# Patient Record
Sex: Female | Born: 1980
Health system: Southern US, Community
[De-identification: ages and names within clinical notes are randomized; demographics above are authoritative.]

## PROBLEM LIST (undated history)

## (undated) ENCOUNTER — Inpatient Hospital Stay (HOSPITAL_COMMUNITY): Payer: Medicaid Other

## (undated) DIAGNOSIS — M1990 Unspecified osteoarthritis, unspecified site: Secondary | ICD-10-CM

## (undated) DIAGNOSIS — K219 Gastro-esophageal reflux disease without esophagitis: Secondary | ICD-10-CM

## (undated) DIAGNOSIS — J45909 Unspecified asthma, uncomplicated: Secondary | ICD-10-CM

## (undated) DIAGNOSIS — Z8669 Personal history of other diseases of the nervous system and sense organs: Secondary | ICD-10-CM

## (undated) DIAGNOSIS — M5432 Sciatica, left side: Secondary | ICD-10-CM

## (undated) DIAGNOSIS — R87629 Unspecified abnormal cytological findings in specimens from vagina: Secondary | ICD-10-CM

## (undated) DIAGNOSIS — I10 Essential (primary) hypertension: Secondary | ICD-10-CM

## (undated) DIAGNOSIS — M797 Fibromyalgia: Secondary | ICD-10-CM

## (undated) DIAGNOSIS — K2970 Gastritis, unspecified, without bleeding: Secondary | ICD-10-CM

## (undated) DIAGNOSIS — Z8719 Personal history of other diseases of the digestive system: Secondary | ICD-10-CM

## (undated) DIAGNOSIS — N12 Tubulo-interstitial nephritis, not specified as acute or chronic: Secondary | ICD-10-CM

## (undated) DIAGNOSIS — R519 Headache, unspecified: Secondary | ICD-10-CM

## (undated) HISTORY — DX: Essential (primary) hypertension: I10

## (undated) HISTORY — PX: DILATION AND CURETTAGE OF UTERUS: SHX78

## (undated) HISTORY — PX: MOUTH SURGERY: SHX715

---
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
## 2014-03-31 ENCOUNTER — Encounter (HOSPITAL_COMMUNITY)
Admission: RE | Admit: 2014-03-31 | Discharge: 2014-03-31 | Disposition: A | Discharge status: Home / Self Care | Payer: Medicaid Other | Source: Ambulatory Visit | Attending: General Surgery | Admitting: General Surgery

## 2014-03-31 NOTE — Consult Note (Signed)
Mia Waters, Mia Waters                 ACCOUNT NO.:  [id number]  MEDICAL RECORD NO.:  [id number]  LOCATION:                                 FACILITY:  PHYSICIAN:  Mia Waters, M.D. DATE OF BIRTH:  09-03-1980  DATE OF CONSULTATION:  03/28/2014 DATE OF DISCHARGE:                                CONSULTATION   HISTORY OF PRESENT ILLNESS:  Surgery was asked to see this 33 year old white female for an umbilical hernia.  In essence, she has had this for the last 2 years, likely related to her many pregnancies.  Occasionally, this is incarcerated but it is reducible.  This has been causing her increased discomfort.  Also, she has had other gastrointestinal symptoms that have been under review by her medical service and will be requiring a colonoscopy as well.  The patient states that sometimes she has nausea and she has some diarrhea and constipation.  She has no bleeding, no mucusy diarrhea.  No unexplained weight loss.  No family history of irritable bowel syndrome or inflammatory bowel disease.  She had a CT scan in [place], the results of which are pending for [place].  Physical examination discloses a very pleasant 33 year old white female, in no acute distress.  PAST MEDICAL HISTORY:  Fairly unremarkable except for these GI complaints and some history of reflux.  She has no current medical problems.  She has switched over to a more of a vegetarian diet and actually her symptoms have improved.  PAST SURGICAL HISTORY:  She has had oral surgery in the past.  No other surgery.  ALLERGIES:  She has no known allergies.  She does smoke about 10 cigarettes per day.  No history of alcohol abuse.  PHYSICAL EXAMINATION:  VITAL SIGNS:  She is 5 feet 8 inches, weighs 218 pounds.  Temperature is 97.0, pulse 68, respirations 12, blood pressure 120/80. HEENT:  Head is normocephalic.  Eyes, extraocular movements are intact. Pupils are round and reactive to light and accommodation.  There is  no conjunctival pallor or scleral injection.  Sclerae have a normal tincture. NECK:  There are no bruits appreciated.  No adenopathy.  No thyromegaly. CHEST:  Clear, both anterior and posterior auscultation. HEART:  Regular rhythm. ABDOMEN:  Soft.  She has an umbilical hernia that is reducible.  It is approximately 2 cm in diameter. RECTAL:  Stool is guaiac negative. PELVIC:  Deferred. EXTREMITIES:  Within normal limits.  REVIEW OF SYSTEMS:  Neurological:  History of migraines.  No history of seizures or any lateralizing neurological findings.  Endocrine:  No history of diabetes, thyroid disease, or adrenal problems. Cardiopulmonary:  The patient does use tobacco.  Musculoskeletal:  The patient is obese.  Ob/Gyn History:  She is a gravida 6, para 5, cesarean 0, abortus 1 patient with no family history of carcinoma of the breast. She has never had a mammogram.  Last menstrual period was March 18, 2014.  GI:  History of reflux, history of recurrent constipation and diarrhea, has somewhat improved with Bentyl and diet manipulation.  No past history of hepatitis.  No past history of bright red rectal bleeding, melena; and as stated, no family history of  inflammatory bowel disease or irritable bowel syndrome.  No unexplained weight loss.  Never had a colonoscopy, although this might be a consideration.  GU:  No history of frequency, dysuria, or kidney stones.  REVIEW OF HISTORY AND PHYSICAL:  Therefore, Ms. Mia Waters is a 33 year old white female who has an umbilical hernia which we will repair via the Outpatient Department.  I think, part of her abdominal discomfort comes from this as this occasionally is incarcerated.  I told her how to relax her musculature in her abdomen and to massage this to make sure that this is reducible and to call us should she have any acute problems with that.  We will also check her CT scan prior to surgery.  We discussed complications including bleeding,  infection, and recurrence, and the possibility of mesh prosthesis might be required.  Informed consent was obtained.     Mia BarthelWilliam Kenita Waters, M.D.     WB/MEDQ  D:  03/28/2014  T:  03/28/2014  Job:  [id number]  cc:   [facility]

## 2014-03-31 NOTE — Pre-Procedure Instructions (Signed)
Patient did not show for pre op appointment.  Linda at Dr Bertram SavinBradfords office made aware.  Attempted to contact patient.  No answer or option to leave a message.

## 2014-04-01 ENCOUNTER — Encounter (HOSPITAL_COMMUNITY): Payer: Self-pay | Admitting: Pharmacy Technician

## 2014-04-01 ENCOUNTER — Encounter (HOSPITAL_COMMUNITY)
Admission: RE | Admit: 2014-04-01 | Discharge: 2014-04-01 | Disposition: A | Discharge status: Home / Self Care | Payer: Medicaid Other | Source: Ambulatory Visit | Attending: General Surgery | Admitting: General Surgery

## 2014-04-01 ENCOUNTER — Encounter (HOSPITAL_COMMUNITY): Payer: Self-pay

## 2014-04-01 DIAGNOSIS — K219 Gastro-esophageal reflux disease without esophagitis: Secondary | ICD-10-CM | POA: Diagnosis not present

## 2014-04-01 DIAGNOSIS — K420 Umbilical hernia with obstruction, without gangrene: Secondary | ICD-10-CM | POA: Diagnosis not present

## 2014-04-01 HISTORY — DX: Gastro-esophageal reflux disease without esophagitis: K21.9

## 2014-04-01 HISTORY — DX: Personal history of other diseases of the nervous system and sense organs: Z86.69

## 2014-04-01 HISTORY — DX: Personal history of other diseases of the digestive system: Z87.19

## 2014-04-01 HISTORY — DX: Unspecified asthma, uncomplicated: J45.909

## 2014-04-01 LAB — CBC WITH DIFFERENTIAL/PLATELET
Basophils Absolute: 0.1 10*3/uL (ref 0.0–0.1)
Basophils Relative: 1 % (ref 0–1)
Eosinophils Absolute: 0.5 10*3/uL (ref 0.0–0.7)
Eosinophils Relative: 5 % (ref 0–5)
HCT: 38.8 % (ref 36.0–46.0)
Hemoglobin: 12.9 g/dL (ref 12.0–15.0)
Lymphocytes Relative: 33 % (ref 12–46)
Lymphs Abs: 3.7 10*3/uL (ref 0.7–4.0)
MCH: 30.6 pg (ref 26.0–34.0)
MCHC: 33.2 g/dL (ref 30.0–36.0)
MCV: 92.2 fL (ref 78.0–100.0)
Monocytes Absolute: 0.7 10*3/uL (ref 0.1–1.0)
Monocytes Relative: 6 % (ref 3–12)
Neutro Abs: 6.2 10*3/uL (ref 1.7–7.7)
Neutrophils Relative %: 55 % (ref 43–77)
Platelets: 395 10*3/uL (ref 150–400)
RBC: 4.21 MIL/uL (ref 3.87–5.11)
RDW: 13.6 % (ref 11.5–15.5)
WBC: 11.2 10*3/uL — ABNORMAL HIGH (ref 4.0–10.5)

## 2014-04-01 LAB — BASIC METABOLIC PANEL
Anion gap: 15 (ref 5–15)
BUN: 5 mg/dL — ABNORMAL LOW (ref 6–23)
CO2: 24 mEq/L (ref 19–32)
Calcium: 9.5 mg/dL (ref 8.4–10.5)
Chloride: 100 mEq/L (ref 96–112)
Creatinine, Ser: 0.76 mg/dL (ref 0.50–1.10)
GFR calc Af Amer: >90 mL/min (ref >90)
GFR calc non Af Amer: >90 mL/min (ref >90)
Glucose, Bld: 90 mg/dL (ref 70–99)
Potassium: 4 mEq/L (ref 3.7–5.3)
Sodium: 139 mEq/L (ref 137–147)

## 2014-04-01 LAB — HCG, SERUM, QUALITATIVE: Preg, Serum: NEGATIVE

## 2014-04-01 NOTE — Patient Instructions (Signed)
Mia Waters  04/01/2014   Your procedure is scheduled on:  Thursday, 04/03/14  [name] at [facility] AM.  Call this number if you have problems the morning of surgery: [phone]   Remember:   Do not eat food or drink liquids after midnight.   Take these medicines the morning of surgery with A SIP OF WATER: prilosec   Do not wear jewelry, make-up or nail polish.  Do not wear lotions, powders, or perfumes. You may wear deodorant.  Do not shave 48 hours prior to surgery. Men may shave face and neck.  Do not bring valuables to the hospital.  [facility] is not responsible                  for any belongings or valuables.               Contacts, dentures or bridgework may not be worn into surgery.  Leave suitcase in the car. After surgery it may be brought to your room.  For patients admitted to the hospital, discharge time is determined by your                treatment team.               Patients discharged the day of surgery will not be allowed to drive  home.  Name and phone number of your driver: family  Special Instructions: Shower using CHG 2 nights before surgery and the night before surgery.  If you shower the day of surgery use CHG.  Use special wash - you have one bottle of CHG for all showers.  You should use approximately 1/3 of the bottle for each shower.   Please read over the following fact sheets that you were given: Anesthesia Post-op Instructions and Care and Recovery After Surgery Umbilical Herniorrhaphy, Care After Refer to this sheet in the next few weeks. These instructions provide you with information on caring for yourself after your procedure. Your health care provider may also give you more specific instructions. Your treatment has been planned according to current medical practices, but problems sometimes occur. Call your health care provider if you have any problems or questions after your procedure. HOME CARE INSTRUCTIONS  If you are given antibiotic  medicine, take it as directed. Finish it even if you start to feel better.  Only take over-the-counter or prescription medicines for pain, fever, or discomfort as directed by your health care provider. Do not take aspirin. It can cause bleeding.  Do not get your surgical cut (incision) area wet unless your health care provider says it is okay.  Avoid lifting objects heavier than 10 lb (4.5 kg) for 8 weeks after surgery.  Avoid sexual activity for 5 weeks after surgery or as directed by your health care provider.  Do not drive while taking prescription pain medicine.  You may return to your other normal, daily activities after 3 days or as directed by your health care provider. SEEK MEDICAL CARE IF:  You notice blood or fluid leaking from the surgical site.  Your incision area becomes red or swollen.  Your pain at the surgical site becomes worse or is not relieved by medicine.  You have problems urinating.  You feel nauseous or vomit more than 2 days after surgery.  You notice the bulge in your abdomen returns after the procedure. SEEK IMMEDIATE MEDICAL CARE IF:  You have a fever.  You have nausea or vomiting that will  not stop. Document Released: 12/13/2011 Document Revised: 10/28/2013 Document Reviewed: 12/13/2011 [facility] Patient Information 2015 [place], [place]. This information is not intended to replace advice given to you by your health care provider. Make sure you discuss any questions you have with your health care provider. General Anesthesia, Care After Refer to this sheet in the next few weeks. These instructions provide you with information on caring for yourself after your procedure. Your health care provider may also give you more specific instructions. Your treatment has been planned according to current medical practices, but problems sometimes occur. Call your health care provider if you have any problems or questions after your procedure. WHAT TO EXPECT AFTER THE  PROCEDURE After the procedure, it is typical to experience:  Sleepiness.  Nausea and vomiting. HOME CARE INSTRUCTIONS  For the first 24 hours after general anesthesia:  Have a responsible person with you.  Do not drive a car. If you are alone, do not take public transportation.  Do not drink alcohol.  Do not take medicine that has not been prescribed by your health care provider.  Do not sign important papers or make important decisions.  You may resume a normal diet and activities as directed by your health care provider.  Change bandages (dressings) as directed.  If you have questions or problems that seem related to general anesthesia, call the hospital and ask for the anesthetist or anesthesiologist on call. SEEK MEDICAL CARE IF:  You have nausea and vomiting that continue the day after anesthesia.  You develop a rash. SEEK IMMEDIATE MEDICAL CARE IF:   You have difficulty breathing.  You have chest pain.  You have any allergic problems. Document Released: 09/19/2000 Document Revised: 06/18/2013 Document Reviewed: 12/27/2012 [facility] Patient Information 2015 [place], [place]. This information is not intended to replace advice given to you by your health care provider. Make sure you discuss any questions you have with your health care provider.

## 2014-04-03 ENCOUNTER — Ambulatory Visit (HOSPITAL_COMMUNITY)
Admission: RE | Admit: 2014-04-03 | Discharge: 2014-04-04 | Disposition: A | Discharge status: Home / Self Care | Payer: Medicaid Other | Source: Ambulatory Visit | Attending: General Surgery | Admitting: General Surgery

## 2014-04-03 ENCOUNTER — Encounter (HOSPITAL_COMMUNITY)
Admission: RE | Disposition: A | Discharge status: Home / Self Care | Payer: Self-pay | Source: Ambulatory Visit | Attending: General Surgery

## 2014-04-03 ENCOUNTER — Observation Stay (HOSPITAL_COMMUNITY): Payer: Medicaid Other | Admitting: Anesthesiology

## 2014-04-03 ENCOUNTER — Encounter (HOSPITAL_COMMUNITY): Payer: Self-pay | Admitting: *Deleted

## 2014-04-03 ENCOUNTER — Encounter (HOSPITAL_COMMUNITY): Payer: Medicaid Other | Admitting: Anesthesiology

## 2014-04-03 DIAGNOSIS — K420 Umbilical hernia with obstruction, without gangrene: Secondary | ICD-10-CM | POA: Diagnosis present

## 2014-04-03 DIAGNOSIS — K219 Gastro-esophageal reflux disease without esophagitis: Secondary | ICD-10-CM | POA: Diagnosis not present

## 2014-04-03 HISTORY — PX: UMBILICAL HERNIA REPAIR: SHX196

## 2014-04-03 SURGERY — REPAIR, HERNIA, UMBILICAL, ADULT
Anesthesia: General

## 2014-04-03 MED ORDER — LIDOCAINE HCL (CARDIAC) 20 MG/ML IV SOLN
INTRAVENOUS | Status: DC | PRN
  Administered 2014-04-03: 40 mg via INTRAVENOUS

## 2014-04-03 MED ORDER — ONDANSETRON HCL 4 MG/2ML IJ SOLN
4.0000 mg | Freq: Once | INTRAMUSCULAR | Status: AC
  Administered 2014-04-03: 4 mg via INTRAVENOUS

## 2014-04-03 MED ORDER — LORAZEPAM 2 MG/ML IJ SOLN
0.5000 mg | Freq: Four times a day (QID) | INTRAMUSCULAR | Status: DC
  Administered 2014-04-03 – 2014-04-04 (×4): 0.5 mg via INTRAVENOUS
  Filled 2014-04-03 (×3): qty 1

## 2014-04-03 MED ORDER — ONDANSETRON HCL 4 MG PO TABS
4.0000 mg | ORAL_TABLET | Freq: Four times a day (QID) | ORAL | Status: DC | PRN
Start: 2014-04-03 — End: 2014-04-04

## 2014-04-03 MED ORDER — MIDAZOLAM HCL 2 MG/2ML IJ SOLN
1.0000 mg | INTRAMUSCULAR | Status: AC | PRN
  Administered 2014-04-03 (×3): 2 mg via INTRAVENOUS
  Filled 2014-04-03: qty 2

## 2014-04-03 MED ORDER — FENTANYL CITRATE 0.05 MG/ML IJ SOLN
INTRAMUSCULAR | Status: AC
  Filled 2014-04-03: qty 2

## 2014-04-03 MED ORDER — LACTATED RINGERS IV SOLN
INTRAVENOUS | Status: DC
  Administered 2014-04-03: 10:00:00 via INTRAVENOUS

## 2014-04-03 MED ORDER — GLYCOPYRROLATE 0.2 MG/ML IJ SOLN
INTRAMUSCULAR | Status: AC
  Filled 2014-04-03: qty 4

## 2014-04-03 MED ORDER — MIDAZOLAM HCL 2 MG/2ML IJ SOLN
1.0000 mg | INTRAMUSCULAR | Status: DC | PRN
  Administered 2014-04-03: 2 mg via INTRAVENOUS

## 2014-04-03 MED ORDER — NEOSTIGMINE METHYLSULFATE 10 MG/10ML IV SOLN
INTRAVENOUS | Status: AC
  Filled 2014-04-03: qty 1

## 2014-04-03 MED ORDER — ALBUTEROL SULFATE (2.5 MG/3ML) 0.083% IN NEBU
INHALATION_SOLUTION | RESPIRATORY_TRACT | Status: AC
  Filled 2014-04-03: qty 3

## 2014-04-03 MED ORDER — MIDAZOLAM HCL 2 MG/2ML IJ SOLN
INTRAMUSCULAR | Status: AC
  Filled 2014-04-03: qty 2

## 2014-04-03 MED ORDER — ALBUTEROL SULFATE (2.5 MG/3ML) 0.083% IN NEBU
2.5000 mg | INHALATION_SOLUTION | Freq: Once | RESPIRATORY_TRACT | Status: AC
  Administered 2014-04-03: 2.5 mg via RESPIRATORY_TRACT

## 2014-04-03 MED ORDER — ONDANSETRON HCL 4 MG/2ML IJ SOLN: 4.0000 mg | Freq: Once | INTRAMUSCULAR | Status: DC | PRN

## 2014-04-03 MED ORDER — FENTANYL CITRATE 0.05 MG/ML IJ SOLN
INTRAMUSCULAR | Status: AC
  Filled 2014-04-03: qty 5

## 2014-04-03 MED ORDER — SODIUM CHLORIDE 0.9 % IJ SOLN
INTRAMUSCULAR | Status: AC
  Filled 2014-04-03: qty 10

## 2014-04-03 MED ORDER — HYDROCODONE-ACETAMINOPHEN 5-325 MG PO TABS
1.0000 | ORAL_TABLET | Freq: Four times a day (QID) | ORAL | Status: DC | PRN
  Administered 2014-04-03 – 2014-04-04 (×4): 1 via ORAL
  Filled 2014-04-03 (×4): qty 1

## 2014-04-03 MED ORDER — SUCCINYLCHOLINE CHLORIDE 20 MG/ML IJ SOLN
INTRAMUSCULAR | Status: DC | PRN
  Administered 2014-04-03: 120 mg via INTRAVENOUS

## 2014-04-03 MED ORDER — PROPOFOL 10 MG/ML IV BOLUS
INTRAVENOUS | Status: DC | PRN
  Administered 2014-04-03: 150 mg via INTRAVENOUS

## 2014-04-03 MED ORDER — BUPIVACAINE HCL (PF) 0.5 % IJ SOLN
INTRAMUSCULAR | Status: AC
  Filled 2014-04-03: qty 30

## 2014-04-03 MED ORDER — ALBUTEROL SULFATE (2.5 MG/3ML) 0.083% IN NEBU: 2.5000 mg | INHALATION_SOLUTION | Freq: Four times a day (QID) | RESPIRATORY_TRACT | Status: DC | PRN

## 2014-04-03 MED ORDER — LORAZEPAM 2 MG/ML IJ SOLN
INTRAMUSCULAR | Status: AC
  Administered 2014-04-03: 0.5 mg via INTRAVENOUS
  Filled 2014-04-03: qty 1

## 2014-04-03 MED ORDER — FENTANYL CITRATE 0.05 MG/ML IJ SOLN
25.0000 ug | INTRAMUSCULAR | Status: AC
  Administered 2014-04-03 (×2): 25 ug via INTRAVENOUS

## 2014-04-03 MED ORDER — GLYCOPYRROLATE 0.2 MG/ML IJ SOLN
INTRAMUSCULAR | Status: DC | PRN
  Administered 2014-04-03: .5 mg via INTRAVENOUS

## 2014-04-03 MED ORDER — DOCUSATE SODIUM 100 MG PO CAPS
100.0000 mg | ORAL_CAPSULE | Freq: Every day | ORAL | Status: DC
Start: 2014-04-03 — End: 2014-04-04
  Administered 2014-04-03 – 2014-04-04 (×2): 100 mg via ORAL
  Filled 2014-04-03 (×2): qty 1

## 2014-04-03 MED ORDER — INFLUENZA VAC SPLIT QUAD 0.5 ML IM SUSY
0.5000 mL | PREFILLED_SYRINGE | INTRAMUSCULAR | Status: AC
  Administered 2014-04-04: 0.5 mL via INTRAMUSCULAR
  Filled 2014-04-03 (×2): qty 0.5

## 2014-04-03 MED ORDER — SUCCINYLCHOLINE CHLORIDE 20 MG/ML IJ SOLN
INTRAMUSCULAR | Status: AC
  Filled 2014-04-03: qty 1

## 2014-04-03 MED ORDER — ROCURONIUM BROMIDE 50 MG/5ML IV SOLN
INTRAVENOUS | Status: AC
  Filled 2014-04-03: qty 1

## 2014-04-03 MED ORDER — POTASSIUM CHLORIDE IN NACL 20-0.9 MEQ/L-% IV SOLN
INTRAVENOUS | Status: DC
  Administered 2014-04-03 – 2014-04-04 (×2): via INTRAVENOUS

## 2014-04-03 MED ORDER — CEFAZOLIN SODIUM-DEXTROSE 2-3 GM-% IV SOLR
2.0000 g | Freq: Once | INTRAVENOUS | Status: AC
  Administered 2014-04-03: 2 g via INTRAVENOUS
  Filled 2014-04-03 (×2): qty 50

## 2014-04-03 MED ORDER — EPHEDRINE SULFATE 50 MG/ML IJ SOLN
INTRAMUSCULAR | Status: AC
  Filled 2014-04-03: qty 1

## 2014-04-03 MED ORDER — NEOSTIGMINE METHYLSULFATE 10 MG/10ML IV SOLN
INTRAVENOUS | Status: DC | PRN
  Administered 2014-04-03: 3 mg via INTRAVENOUS

## 2014-04-03 MED ORDER — ONDANSETRON HCL 4 MG/2ML IJ SOLN: 4.0000 mg | Freq: Four times a day (QID) | INTRAMUSCULAR | Status: DC | PRN

## 2014-04-03 MED ORDER — KETOPROFEN 75 MG PO CAPS
75.0000 mg | ORAL_CAPSULE | Freq: Two times a day (BID) | ORAL | Status: DC
  Filled 2014-04-03 (×8): qty 1

## 2014-04-03 MED ORDER — BACITRACIN ZINC 500 UNIT/GM EX OINT
TOPICAL_OINTMENT | CUTANEOUS | Status: DC | PRN
  Administered 2014-04-03: 1 via TOPICAL

## 2014-04-03 MED ORDER — ONDANSETRON HCL 4 MG/2ML IJ SOLN
INTRAMUSCULAR | Status: AC
  Filled 2014-04-03: qty 2

## 2014-04-03 MED ORDER — LORAZEPAM 0.5 MG PO TABS
0.5000 mg | ORAL_TABLET | Freq: Every day | ORAL | Status: DC
  Administered 2014-04-03: 0.5 mg via ORAL
  Filled 2014-04-03: qty 1

## 2014-04-03 MED ORDER — ROCURONIUM BROMIDE 100 MG/10ML IV SOLN
INTRAVENOUS | Status: DC | PRN
  Administered 2014-04-03: 30 mg via INTRAVENOUS
  Administered 2014-04-03 (×2): 5 mg via INTRAVENOUS

## 2014-04-03 MED ORDER — PNEUMOCOCCAL VAC POLYVALENT 25 MCG/0.5ML IJ INJ
0.5000 mL | INJECTION | INTRAMUSCULAR | Status: AC
  Administered 2014-04-04: 0.5 mL via INTRAMUSCULAR
  Filled 2014-04-03 (×2): qty 0.5

## 2014-04-03 MED ORDER — ACETAMINOPHEN 325 MG PO TABS: 650.0000 mg | ORAL_TABLET | Freq: Four times a day (QID) | ORAL | Status: DC | PRN

## 2014-04-03 MED ORDER — FENTANYL CITRATE 0.05 MG/ML IJ SOLN
INTRAMUSCULAR | Status: DC | PRN
  Administered 2014-04-03 (×9): 50 ug via INTRAVENOUS

## 2014-04-03 MED ORDER — FENTANYL CITRATE 0.05 MG/ML IJ SOLN
25.0000 ug | INTRAMUSCULAR | Status: DC | PRN
  Administered 2014-04-03 (×3): 50 ug via INTRAVENOUS

## 2014-04-03 MED ORDER — 0.9 % SODIUM CHLORIDE (POUR BTL) OPTIME
TOPICAL | Status: DC | PRN
  Administered 2014-04-03: 1000 mL

## 2014-04-03 MED ORDER — MORPHINE SULFATE 2 MG/ML IJ SOLN
1.0000 mg | INTRAMUSCULAR | Status: DC | PRN
  Administered 2014-04-03 – 2014-04-04 (×7): 1 mg via INTRAVENOUS
  Filled 2014-04-03 (×7): qty 1

## 2014-04-03 MED ORDER — BACITRACIN ZINC 500 UNIT/GM EX OINT
TOPICAL_OINTMENT | CUTANEOUS | Status: AC
  Filled 2014-04-03: qty 0.9

## 2014-04-03 MED ORDER — PANTOPRAZOLE SODIUM 40 MG PO TBEC
40.0000 mg | DELAYED_RELEASE_TABLET | Freq: Every day | ORAL | Status: DC
  Administered 2014-04-03 – 2014-04-04 (×2): 40 mg via ORAL
  Filled 2014-04-03 (×2): qty 1

## 2014-04-03 SURGICAL SUPPLY — 46 items
ATTRACTOMAT 16X20 MAGNETIC DRP (DRAPES) ×2 IMPLANT
BAG HAMPER (MISCELLANEOUS) ×2 IMPLANT
BLADE SURG 15 STRL LF DISP TIS (BLADE) ×1 IMPLANT
BLADE SURG 15 STRL SS (BLADE) ×1
CLEANER TIP ELECTROSURG 2X2 (MISCELLANEOUS) ×2 IMPLANT
CLOTH BEACON ORANGE TIMEOUT ST (SAFETY) ×2 IMPLANT
COVER LIGHT HANDLE STERIS (MISCELLANEOUS) ×4 IMPLANT
DECANTER SPIKE VIAL GLASS SM (MISCELLANEOUS) ×2 IMPLANT
ELECT REM PT RETURN 9FT ADLT (ELECTROSURGICAL) ×2
ELECTRODE REM PT RTRN 9FT ADLT (ELECTROSURGICAL) ×1 IMPLANT
FORMALIN 10 PREFIL 120ML (MISCELLANEOUS) ×2 IMPLANT
GAUZE SPONGE 4X4 12PLY STRL (GAUZE/BANDAGES/DRESSINGS) ×2 IMPLANT
GLOVE SKINSENSE NS SZ7.0 (GLOVE) ×1
GLOVE SKINSENSE STRL SZ7.0 (GLOVE) ×1 IMPLANT
GOWN STRL REUS W/TWL LRG LVL3 (GOWN DISPOSABLE) ×4 IMPLANT
INST SET MINOR GENERAL (KITS) ×2 IMPLANT
KIT ROOM TURNOVER APOR (KITS) ×2 IMPLANT
MANIFOLD NEPTUNE II (INSTRUMENTS) ×2 IMPLANT
NEEDLE HYPO 21X1.5 SAFETY (NEEDLE) ×2 IMPLANT
NS IRRIG 1000ML POUR BTL (IV SOLUTION) ×2 IMPLANT
PACK MINOR (CUSTOM PROCEDURE TRAY) ×2 IMPLANT
PAD ARMBOARD 7.5X6 YLW CONV (MISCELLANEOUS) ×2 IMPLANT
SET BASIN LINEN APH (SET/KITS/TRAYS/PACK) ×2 IMPLANT
SOL PREP PROV IODINE SCRUB 4OZ (MISCELLANEOUS) ×2 IMPLANT
SPONGE GAUZE 2X2 8PLY STRL LF (GAUZE/BANDAGES/DRESSINGS) ×2 IMPLANT
SPONGE GAUZE 4X4 12PLY (GAUZE/BANDAGES/DRESSINGS) ×2 IMPLANT
SPONGE INTESTINAL PEANUT (DISPOSABLE) ×2 IMPLANT
SPONGE LAP 18X18 X RAY DECT (DISPOSABLE) ×2 IMPLANT
STAPLER VISISTAT 35W (STAPLE) ×2 IMPLANT
SUT PROLENE 0 CT 1 CR/8 (SUTURE) IMPLANT
SUT PROLENE 1 CT 1 30 (SUTURE) ×2 IMPLANT
SUT PROLENE 4 0 PS 2 18 (SUTURE) IMPLANT
SUT PROLENE MO 6 BLUE #0 30IN (SUTURE) IMPLANT
SUT SILK 2 0 (SUTURE) ×1
SUT SILK 2-0 18XBRD TIE 12 (SUTURE) ×1 IMPLANT
SUT VIC AB 3-0 SH 27 (SUTURE) ×2
SUT VIC AB 3-0 SH 27X BRD (SUTURE) ×2 IMPLANT
SUT VIC AB 5-0 P-3 18X BRD (SUTURE) IMPLANT
SUT VIC AB 5-0 P3 18 (SUTURE)
SUT VICRYL AB 3 0 TIES (SUTURE) IMPLANT
SUT VICRYL AB 3-0 BRD CT 36IN (SUTURE) ×2 IMPLANT
SYR BULB IRRIGATION 50ML (SYRINGE) ×2 IMPLANT
SYR CONTROL 10ML LL (SYRINGE) ×2 IMPLANT
TAPE CLOTH SURG 4X10 WHT LF (GAUZE/BANDAGES/DRESSINGS) ×2 IMPLANT
TRAY FOLEY CATH 16FR SILVER (SET/KITS/TRAYS/PACK) ×2 IMPLANT
WATER STERILE IRR 1000ML POUR (IV SOLUTION) ×4 IMPLANT

## 2014-04-03 NOTE — Transfer of Care (Signed)
Immediate Anesthesia Transfer of Care Note  Patient: Mia Waters  Procedure(s) Performed: Procedure(s): HERNIA REPAIR UMBILICAL  (N/A)  Patient Location: PACU  Anesthesia Type:General  Level of Consciousness: awake, alert  and oriented  Airway & Oxygen Therapy: Patient Spontanous Breathing and Patient connected to face mask oxygen  Post-op Assessment: Report given to PACU RN  Post vital signs: Reviewed and stable  Complications: No apparent anesthesia complications

## 2014-04-03 NOTE — Brief Op Note (Signed)
04/03/2014  11:56 AM  PATIENT:  Derry SkillAmy D Luevanos  33 y.o. female  PRE-OPERATIVE DIAGNOSIS:  umbilical hernia  POST-OPERATIVE DIAGNOSIS:  incarcerated umbilical hernia  PROCEDURE:  Procedure(s): HERNIA REPAIR UMBILICAL  (N/A)  SURGEON:  Surgeon(s) and Role:    * Marlane HatcherWilliam S Keyonta Barradas, MD - Primary  PHYSICIAN ASSISTANT:   ASSISTANTS: none   ANESTHESIA:   general  EBL:  Total I/O In: 300 [I.V.:300] Out: 175 [Urine:150; Blood:25]  BLOOD ADMINISTERED:none  DRAINS: none   LOCAL MEDICATIONS USED:  NONE  SPECIMEN:  Source of Specimen:  Umbilical hernia sac and incarcerated omentum.  DISPOSITION OF SPECIMEN:  PATHOLOGY  COUNTS:  YES  TOURNIQUET:  * No tourniquets in log *  DICTATION: .Other Dictation: Dictation Number OR dict.# [id number].  PLAN OF CARE: Admit for overnight observation  PATIENT DISPOSITION:  PACU - hemodynamically stable.   Delay start of Pharmacological VTE agent (>24hrs) due to surgical blood loss or risk of bleeding: not applicable

## 2014-04-03 NOTE — Anesthesia Procedure Notes (Signed)
Procedure Name: Intubation Date/Time: 04/03/2014 10:43 AM Performed by: Glynn OctaveANIEL, Gwendolyne Welford E Pre-anesthesia Checklist: Patient identified, Patient being monitored, Timeout performed, Emergency Drugs available and Suction available Patient Re-evaluated:Patient Re-evaluated prior to inductionOxygen Delivery Method: Circle System Utilized Preoxygenation: Pre-oxygenation with 100% oxygen Intubation Type: IV induction, Rapid sequence and Cricoid Pressure applied Ventilation: Mask ventilation without difficulty Laryngoscope Size: Mac and 3 Grade View: Grade I Tube type: Oral Tube size: 7.0 mm Number of attempts: 1 Airway Equipment and Method: stylet Placement Confirmation: ETT inserted through vocal cords under direct vision,  positive ETCO2 and breath sounds checked- equal and bilateral Secured at: 22 cm Tube secured with: Tape Dental Injury: Teeth and Oropharynx as per pre-operative assessment

## 2014-04-03 NOTE — Progress Notes (Signed)
33 yr old W. Female for elective repair of umbilical hernia.  Procedure and risks explained and informed consent obtained.  We also addressed the possibility that mesh might be used in the repair.  No change since H&P, dict. # [id number].  Filed Vitals:   04/03/14 0930  BP: 125/84  Pulse: 82  Temp: 97.7 F (36.5 C)  Resp: 16

## 2014-04-03 NOTE — Anesthesia Postprocedure Evaluation (Signed)
  Anesthesia Post-op Note  Patient: Mia Waters  Procedure(s) Performed: Procedure(s): HERNIA REPAIR UMBILICAL  (N/A)  Patient Location: PACU  Anesthesia Type:General  Level of Consciousness: awake, alert  and oriented  Airway and Oxygen Therapy: Patient Spontanous Breathing and Patient connected to face mask oxygen  Post-op Pain: mild  Post-op Assessment: Post-op Vital signs reviewed, Patient's Cardiovascular Status Stable, Respiratory Function Stable, Patent Airway and No signs of Nausea or vomiting  Post-op Vital Signs: Reviewed and stable  Last Vitals:  Filed Vitals:   04/03/14 1031  BP: 116/70  Pulse:   Temp:   Resp: 31    Complications: No apparent anesthesia complications

## 2014-04-03 NOTE — Progress Notes (Signed)
Post OP Check  Filed Vitals:   04/03/14 1400  BP: 134/77  Pulse: 83  Temp: 97.8 F (36.6 C)  Resp: 15    Pt awake and alert.   Dressings dry and in tact. Tolerating liquids.   Requiring parenteral pain meds as expected, but doing well post op.

## 2014-04-03 NOTE — Op Note (Signed)
NAMClyde Lundborg:  Mcevoy, Nolia                 ACCOUNT NO.:  [id number]  MEDICAL RECORD NO.:  [id number]  LOCATION:  A301                          FACILITY:  [facility]  PHYSICIAN:  Barbaraann BarthelWilliam Carrel Leather, M.D. DATE OF BIRTH:  1981-04-04  DATE OF PROCEDURE:  04/03/2014 DATE OF DISCHARGE:                              OPERATIVE REPORT   DIAGNOSIS:  Umbilical hernia (incarcerated).  PROCEDURE:  Umbilical herniorrhaphy (no mesh required).  WOUND CLASSIFICATION:  Clean.  GROSS OPERATIVE FINDINGS:  The patient had incarcerated omentum within the hernia defect that was approximately the size of a quarter.  No mesh was needed in my opinion for repair.  TECHNIQUE:  The patient was placed in supine position.  After the adequate administration of LMA anesthesia, a Foley catheter was aseptically inserted and she was prepped with Betadine solution and draped in usual manner.  Curvilinear incision was carried out at the inferior aspect of the umbilicus.  We dissected the hernia sac off of the skin of the umbilicus and opened the sac and there was adherent omental fat.  This was clamped and ligated, and removed and sent as a specimen.  We then closed the defect with a #1 Prolene suture in a figure-of-eight fashion.  We then tacked the umbilicus skin to the fascia in order to return to its concave appearance and then closed the subcu with 3-0 Polysorb.  We irrigated with normal saline solution and closed the skin with stapling device.  Sterile dressing was applied using 2x2 within the umbilicus with Neosporin on it and 4x4s and tape. The patient tolerated the procedure well.  At the time of closure, all sponge, needle and instrument counts were found to be correct.     Barbaraann BarthelWilliam Olaf Mesa, M.D.     WB/MEDQ  D:  04/03/2014  T:  04/03/2014  Job:  [id number]  cc:   [facility]

## 2014-04-03 NOTE — Anesthesia Preprocedure Evaluation (Signed)
Anesthesia Evaluation  Patient identified by MRN, date of birth, ID band Patient awake    Reviewed: Allergy & Precautions, H&P , NPO status , Patient's Chart, lab work & pertinent test results  Airway Mallampati: I TM Distance: >3 FB Neck ROM: Full    Dental  (+) Teeth Intact   Pulmonary asthma , Current Smoker,  breath sounds clear to auscultation        Cardiovascular negative cardio ROS  Rhythm:Regular Rate:Normal     Neuro/Psych Anxiety    GI/Hepatic hiatal hernia, GERD-  Controlled and Medicated,  Endo/Other  Morbid obesity  Renal/GU      Musculoskeletal   Abdominal   Peds  Hematology   Anesthesia Other Findings   Reproductive/Obstetrics                           Anesthesia Physical Anesthesia Plan  ASA: II  Anesthesia Plan: General   Post-op Pain Management:    Induction: Intravenous, Rapid sequence and Cricoid pressure planned  Airway Management Planned: Oral ETT  Additional Equipment:   Intra-op Plan:   Post-operative Plan: Extubation in OR  Informed Consent: I have reviewed the patients History and Physical, chart, labs and discussed the procedure including the risks, benefits and alternatives for the proposed anesthesia with the patient or authorized representative who has indicated his/her understanding and acceptance.     Plan Discussed with:   Anesthesia Plan Comments:         Anesthesia Quick Evaluation
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
## 2014-04-04 ENCOUNTER — Encounter (HOSPITAL_COMMUNITY): Payer: Self-pay | Admitting: General Surgery

## 2014-04-04 DIAGNOSIS — K420 Umbilical hernia with obstruction, without gangrene: Secondary | ICD-10-CM | POA: Diagnosis not present

## 2014-04-04 MED ORDER — DSS 100 MG PO CAPS: 100.0000 mg | ORAL_CAPSULE | Freq: Every day | ORAL | Status: DC

## 2014-04-04 MED ORDER — HYDROCODONE-ACETAMINOPHEN 5-325 MG PO TABS: 1.0000 | ORAL_TABLET | Freq: Four times a day (QID) | ORAL | Status: DC | PRN
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
## 2014-04-04 NOTE — Progress Notes (Signed)
Patient admitted to smoking in the room on 2A.  Reminded her of the hospitals no smoking policy and the risk with oxygen in the rooms.  Patient reassured me that she would not smoke again.  She was offered a Nicotine Patch on 300 per the nurses report but refused it at the time.

## 2014-04-04 NOTE — Anesthesia Postprocedure Evaluation (Signed)
  Anesthesia Post-op Note  Patient: Mia Waters  Procedure(s) Performed: Procedure(s): HERNIA REPAIR UMBILICAL  (N/A)  Patient Location: Nursing Unit  Anesthesia Type:General  Level of Consciousness: sedated and responds to stimulation  Airway and Oxygen Therapy: Patient Spontanous Breathing  Post-op Pain: none  Post-op Assessment: Post-op Vital signs reviewed, Patient's Cardiovascular Status Stable, Respiratory Function Stable, Patent Airway and No signs of Nausea or vomiting  Post-op Vital Signs: Reviewed and stable  Last Vitals:  Filed Vitals:   04/03/14 2208  BP: 114/70  Pulse: 80  Temp: 36.6 C  Resp: 16    Complications: No apparent anesthesia complications, appropriate response, but moderately sedated from pain med.

## 2014-04-04 NOTE — Addendum Note (Signed)
Addendum created 04/04/14 1107 by Moshe SalisburyKaren E Jaylene Arrowood, CRNA   Modules edited: Notes Section   Notes Section:  File: [id number]

## 2014-04-04 NOTE — Progress Notes (Signed)
UR chart review completed.  
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
## 2014-08-23 ENCOUNTER — Emergency Department (HOSPITAL_COMMUNITY): Payer: Medicaid Other

## 2014-08-23 ENCOUNTER — Encounter (HOSPITAL_COMMUNITY): Payer: Self-pay | Admitting: Emergency Medicine

## 2014-08-23 ENCOUNTER — Emergency Department (HOSPITAL_COMMUNITY)
Admission: EM | Admit: 2014-08-23 | Discharge: 2014-08-23 | Disposition: A | Discharge status: Home / Self Care | Payer: Medicaid Other | Attending: Emergency Medicine | Admitting: Emergency Medicine

## 2014-08-23 DIAGNOSIS — Z791 Long term (current) use of non-steroidal anti-inflammatories (NSAID): Secondary | ICD-10-CM | POA: Insufficient documentation

## 2014-08-23 DIAGNOSIS — N898 Other specified noninflammatory disorders of vagina: Secondary | ICD-10-CM | POA: Diagnosis not present

## 2014-08-23 DIAGNOSIS — Z8719 Personal history of other diseases of the digestive system: Secondary | ICD-10-CM | POA: Insufficient documentation

## 2014-08-23 DIAGNOSIS — J45909 Unspecified asthma, uncomplicated: Secondary | ICD-10-CM | POA: Insufficient documentation

## 2014-08-23 DIAGNOSIS — R634 Abnormal weight loss: Secondary | ICD-10-CM | POA: Diagnosis not present

## 2014-08-23 DIAGNOSIS — Z720 Tobacco use: Secondary | ICD-10-CM | POA: Diagnosis not present

## 2014-08-23 DIAGNOSIS — R1032 Left lower quadrant pain: Secondary | ICD-10-CM | POA: Diagnosis not present

## 2014-08-23 DIAGNOSIS — N939 Abnormal uterine and vaginal bleeding, unspecified: Secondary | ICD-10-CM | POA: Insufficient documentation

## 2014-08-23 DIAGNOSIS — Z8679 Personal history of other diseases of the circulatory system: Secondary | ICD-10-CM | POA: Diagnosis not present

## 2014-08-23 DIAGNOSIS — Z3202 Encounter for pregnancy test, result negative: Secondary | ICD-10-CM | POA: Insufficient documentation

## 2014-08-23 DIAGNOSIS — Z79899 Other long term (current) drug therapy: Secondary | ICD-10-CM | POA: Diagnosis not present

## 2014-08-23 HISTORY — DX: Gastritis, unspecified, without bleeding: K29.70

## 2014-08-23 LAB — CBC WITH DIFFERENTIAL/PLATELET
Basophils Absolute: 0.1 10*3/uL (ref 0.0–0.1)
Basophils Relative: 0 % (ref 0–1)
Eosinophils Absolute: 0.3 10*3/uL (ref 0.0–0.7)
Eosinophils Relative: 3 % (ref 0–5)
HCT: 39.8 % (ref 36.0–46.0)
Hemoglobin: 13.3 g/dL (ref 12.0–15.0)
Lymphocytes Relative: 28 % (ref 12–46)
Lymphs Abs: 3.1 10*3/uL (ref 0.7–4.0)
MCH: 30.8 pg (ref 26.0–34.0)
MCHC: 33.4 g/dL (ref 30.0–36.0)
MCV: 92.1 fL (ref 78.0–100.0)
Monocytes Absolute: 0.7 10*3/uL (ref 0.1–1.0)
Monocytes Relative: 7 % (ref 3–12)
Neutro Abs: 7.1 10*3/uL (ref 1.7–7.7)
Neutrophils Relative %: 62 % (ref 43–77)
Platelets: 348 10*3/uL (ref 150–400)
RBC: 4.32 MIL/uL (ref 3.87–5.11)
RDW: 14.2 % (ref 11.5–15.5)
WBC: 11.3 10*3/uL — ABNORMAL HIGH (ref 4.0–10.5)

## 2014-08-23 LAB — URINALYSIS, ROUTINE W REFLEX MICROSCOPIC
Bilirubin Urine: NEGATIVE
Glucose, UA: NEGATIVE mg/dL
Hgb urine dipstick: NEGATIVE
Ketones, ur: NEGATIVE mg/dL
Leukocytes, UA: NEGATIVE
Nitrite: NEGATIVE
Protein, ur: NEGATIVE mg/dL
Specific Gravity, Urine: 1.01 (ref 1.005–1.030)
Urobilinogen, UA: 0.2 mg/dL (ref 0.0–1.0)
pH: 6 (ref 5.0–8.0)

## 2014-08-23 LAB — COMPREHENSIVE METABOLIC PANEL
ALT: 14 U/L (ref 0–35)
AST: 16 U/L (ref 0–37)
Albumin: 4.2 g/dL (ref 3.5–5.2)
Alkaline Phosphatase: 59 U/L (ref 39–117)
Anion gap: 5 (ref 5–15)
BUN: 9 mg/dL (ref 6–23)
CO2: 22 mmol/L (ref 19–32)
Calcium: 8.9 mg/dL (ref 8.4–10.5)
Chloride: 107 mmol/L (ref 96–112)
Creatinine, Ser: 0.75 mg/dL (ref 0.50–1.10)
GFR calc Af Amer: >90 mL/min (ref >90)
GFR calc non Af Amer: >90 mL/min (ref >90)
Glucose, Bld: 106 mg/dL — ABNORMAL HIGH (ref 70–99)
Potassium: 3.5 mmol/L (ref 3.5–5.1)
Sodium: 134 mmol/L — ABNORMAL LOW (ref 135–145)
Total Bilirubin: 0.3 mg/dL (ref 0.3–1.2)
Total Protein: 7.6 g/dL (ref 6.0–8.3)

## 2014-08-23 LAB — PREGNANCY, URINE: Preg Test, Ur: NEGATIVE

## 2014-08-23 LAB — WET PREP, GENITAL
Clue Cells Wet Prep HPF POC: NONE SEEN
Trich, Wet Prep: NONE SEEN
Yeast Wet Prep HPF POC: NONE SEEN

## 2014-08-23 MED ORDER — SODIUM CHLORIDE 0.9 % IV BOLUS (SEPSIS)
500.0000 mL | Freq: Once | INTRAVENOUS | Status: AC
  Administered 2014-08-23: 500 mL via INTRAVENOUS

## 2014-08-23 MED ORDER — IOHEXOL 300 MG/ML  SOLN
100.0000 mL | Freq: Once | INTRAMUSCULAR | Status: AC | PRN
Start: 2014-08-23 — End: 2014-08-23
  Administered 2014-08-23: 100 mL via INTRAVENOUS

## 2014-08-23 MED ORDER — IOHEXOL 300 MG/ML  SOLN
50.0000 mL | Freq: Once | INTRAMUSCULAR | Status: AC | PRN
  Administered 2014-08-23: 50 mL via ORAL

## 2014-08-23 MED ORDER — ALBUTEROL SULFATE HFA 108 (90 BASE) MCG/ACT IN AERS
2.0000 | INHALATION_SPRAY | Freq: Once | RESPIRATORY_TRACT | Status: AC
  Administered 2014-08-23: 2 via RESPIRATORY_TRACT
  Filled 2014-08-23: qty 6.7

## 2014-08-23 MED ORDER — PROMETHAZINE HCL 25 MG PO TABS: 25.0000 mg | ORAL_TABLET | Freq: Four times a day (QID) | ORAL | Status: DC | PRN

## 2014-08-23 MED ORDER — OXYCODONE-ACETAMINOPHEN 5-325 MG PO TABS: 1.0000 | ORAL_TABLET | ORAL | Status: DC | PRN

## 2014-08-23 NOTE — ED Notes (Addendum)
Patient c/o mid abd pain and left lower abd pain. Per patient has had abd pain x2 years but pain has become severe in past 2 days. Patient reports nausea but denies any vomiting, diarrhea, or fevers. Patient does report pressure with voiding. Patient also reports pain during intercourse with spotting and clear/cloudy discharge with odor afterwards.

## 2014-08-23 NOTE — ED Provider Notes (Signed)
CSN: [id number]     Arrival date & time 08/23/14  1636 History  This chart was scribed for Donnetta Hutching, MD by [place] Peace, ED Scribe. The patient was seen in [facility]. The patient's care was started at 5:08 PM.    Chief Complaint  Patient presents with  . Abdominal Pain      Patient is a 34 y.o. female presenting with abdominal pain. The history is provided by the patient. No language interpreter was used.  Abdominal Pain Pain location:  LLQ Pain radiates to:  L flank and R flank Pain severity:  Severe Timing:  Intermittent Chronicity:  Recurrent Relieved by:  Nothing Associated symptoms: vaginal bleeding and vaginal discharge   Associated symptoms: no diarrhea, no fever and no vomiting     HPI Comments: Mia Waters is a 34 y.o. female who presents to the Emergency Department complaining of intermittent lower abdominal pain onset 2 years that radiates to bilaterally over her intestines Pt explains that pain has gotten progressively worse over the course of the past 3 days to the point where she feels as if her "women parts are about to fall out" . Pt also complains of vaginal bleeding, vaginal discharge, dyspareunia, and an increase in weight. She adds that she also has an I&D in place which she is afraid may have moved or something and could be responsible for current symptoms. Last menstrual cycle was 3 weeks ago. History of abdominal surgery in October 2015 to address umbilical hernia. History of G6, P5, AB1. Pt does have PCP. No complaints of fever, vomiting, or diarrhea.   Past Medical History  Diagnosis Date  . Asthma   . GERD (gastroesophageal reflux disease)   . H/O hiatal hernia   . History of migraine   . Gastritis    Past Surgical History  Procedure Laterality Date  . Mouth surgery    . Umbilical hernia repair N/A 04/03/2014    Procedure: HERNIA REPAIR UMBILICAL ;  Surgeon: Marlane Hatcher, MD;  Location: [facility];  Service: General;  Laterality: N/A;   Family  History  Problem Relation Age of Onset  . Diabetes Mother   . Hypertension Mother   . Cancer Other    History  Substance Use Topics  . Smoking status: Current Every Day Smoker -- 0.50 packs/day for 20 years    Types: Cigarettes  . Smokeless tobacco: Never Used  . Alcohol Use: 0.6 oz/week    1 Glasses of wine per week   OB History    Gravida Para Term Preterm AB TAB SAB Ectopic Multiple Living   Review of Systems  Constitutional: Positive for unexpected weight change. Negative for fever.  Gastrointestinal: Positive for abdominal pain. Negative for vomiting and diarrhea.  Genitourinary: Positive for vaginal bleeding, vaginal discharge and dyspareunia.      Allergies  Review of patient's allergies indicates no known allergies.  Home Medications   Prior to Admission medications   Medication Sig Start Date End Date Taking? Authorizing Provider  acetaminophen (TYLENOL) 325 MG tablet Take 650 mg by mouth every 6 (six) hours as needed for mild pain.    Yes Historical Provider, MD  albuterol (PROVENTIL HFA;VENTOLIN HFA) 108 (90 BASE) MCG/ACT inhaler Inhale 2 puffs into the lungs every 6 (six) hours as needed for wheezing or shortness of breath.   Yes Historical Provider, MD  dicyclomine (BENTYL) 20 MG tablet Take 20 mg by  mouth every 6 (six) hours.   Yes Historical Provider, MD  docusate sodium 100 MG CAPS Take 100 mg by mouth daily. Patient not taking: Reported on 08/23/2014 04/04/14   Marlane HatcherWilliam S Bradford, MD  HYDROcodone-acetaminophen (NORCO/VICODIN) 5-325 MG per tablet Take 1 tablet by mouth every 6 (six) hours as needed for moderate pain. Patient not taking: Reported on 08/23/2014 04/04/14   Marlane HatcherWilliam S Bradford, MD  ibuprofen (ADVIL,MOTRIN) 200 MG tablet Take 400 mg by mouth every 6 (six) hours as needed for mild pain.     Historical Provider, MD  ketoprofen (ORUDIS) 75 MG capsule Take 75 mg by mouth 2 (two) times daily as needed for mild pain.     Historical  Provider, MD  oxyCODONE-acetaminophen (PERCOCET) 5-325 MG per tablet Take 1-2 tablets by mouth every 4 (four) hours as needed. 08/23/14   Donnetta HutchingBrian Gaige Fussner, MD  promethazine (PHENERGAN) 25 MG tablet Take 1 tablet (25 mg total) by mouth every 6 (six) hours as needed. 08/23/14   Donnetta HutchingBrian Mia Granberry, MD   BP 151/88 mmHg  Pulse 98  Temp(Src) 98.1 F (36.7 C) (Oral)  Resp 17  Ht 5\' 8"  (1.727 m)  Wt 208 lb (94.348 kg)  BMI 31.63 kg/m2  SpO2 100%  LMP 08/02/2014 Physical Exam  Constitutional: She is oriented to person, place, and time. She appears well-developed and well-nourished.  HENT:  Head: Normocephalic and atraumatic.  Eyes: Conjunctivae and EOM are normal. Pupils are equal, round, and reactive to light.  Neck: Normal range of motion. Neck supple.  Cardiovascular: Normal rate and regular rhythm.   Pulmonary/Chest: Effort normal and breath sounds normal.  Abdominal: Soft. Bowel sounds are normal. There is tenderness (LLQ).  2cm horizontal scar below umbilicus.   Genitourinary:  Pelvic exam:. Normal external genitalia. No cervical motion tenderness. Minimal white cervical discharge. No adnexal or uterine tenderness  Musculoskeletal: Normal range of motion.  Neurological: She is alert and oriented to person, place, and time.  Skin: Skin is warm and dry.  Psychiatric: She has a normal mood and affect. Her behavior is normal.  Nursing note and vitals reviewed.   ED Course  Procedures (including critical care time) Labs Review Labs Reviewed  WET PREP, GENITAL - Abnormal; Notable for the following:    WBC, Wet Prep HPF POC FEW (*)    All other components within normal limits  CBC WITH DIFFERENTIAL/PLATELET - Abnormal; Notable for the following:    WBC 11.3 (*)    All other components within normal limits  COMPREHENSIVE METABOLIC PANEL - Abnormal; Notable for the following:    Sodium 134 (*)    Glucose, Bld 106 (*)    All other components within normal limits  URINALYSIS, ROUTINE W REFLEX  MICROSCOPIC  PREGNANCY, URINE  GC/CHLAMYDIA PROBE AMP ([place])    Imaging Review Ct Abdomen Pelvis W Contrast  08/23/2014   CLINICAL DATA:  Less abdominal pain and distention. History umbilical hernia. And IUD.  EXAM: CT ABDOMEN AND PELVIS WITH CONTRAST  TECHNIQUE: Multidetector CT imaging of the abdomen and pelvis was performed using the standard protocol following bolus administration of intravenous contrast.  CONTRAST:  50mL OMNIPAQUE IOHEXOL 300 MG/ML SOLN, 100mL OMNIPAQUE IOHEXOL 300 MG/ML SOLN  COMPARISON:  None.  FINDINGS: Lower chest:  Lung bases are clear.  Hepatobiliary: No focal hepatic lesion.  Normal gallbladder.  Pancreas: Intra clear pancreas  Spleen: Normal spleen.  Adrenals/urinary tract: Adrenal glands and kidneys are normal. The ureters and bladder normal.  Stomach/Bowel: Stomach, small bowel, appendix,  cecum normal. Moderate volume stool throughout the colon. Descending colon is normal. No diverticulosis. Rectum is normal.  Vascular/Lymphatic: Abdominal aorta is normal caliber. There is no retroperitoneal or periportal lymphadenopathy. No pelvic lymphadenopathy.  Reproductive: Uterus is normal. IUD in expected location. Ovaries are normal. Enhancing follicle in the right ovary.  Musculoskeletal: No aggressive osseous lesion.  Other: No free-fluid .  No ventral hernia.  No inguinal hernia.  IMPRESSION: 1. No acute abdominal or pelvic findings. No explanation for left-sided pain. 2. Normal appendix. 3. IUD in expected location.   Electronically Signed   By: Genevive Bi M.D.   On: 08/23/2014 18:59     EKG Interpretation None     Medications  albuterol (PROVENTIL HFA;VENTOLIN HFA) 108 (90 BASE) MCG/ACT inhaler 2 puff (not administered)  sodium chloride 0.9 % bolus 500 mL (0 mLs Intravenous Stopped 08/23/14 1855)  iohexol (OMNIPAQUE) 300 MG/ML solution 50 mL (50 mLs Oral Contrast Given 08/23/14 1746)  iohexol (OMNIPAQUE) 300 MG/ML solution 100 mL (100 mLs Intravenous Contrast  Given 08/23/14 1819)    5:13 PM- Treatment plan was discussed with patient who verbalizes understanding and agrees.   MDM   Final diagnoses:  LLQ pain    Patient is in no acute distress. No acute abdomen. CT scan negative. Pelvic exam negative for acute findings. Discharge medications Percocet and Phenergan 25 mg  I personally performed the services described in this documentation, which was scribed in my presence. The recorded information has been reviewed and is accurate.    Donnetta Hutching, MD 08/23/14 2018

## 2014-08-23 NOTE — Discharge Instructions (Signed)
CT scan and blood work showed no life-threatening problems. Prescription for pain and nausea medicine. Follow-up your doctor or return if worse

## 2014-08-23 NOTE — ED Notes (Signed)
MD at bedside completing pelvic exam with female NT assisting.

## 2014-08-25 LAB — GC/CHLAMYDIA PROBE AMP (~~LOC~~) NOT AT ARMC
Chlamydia: NEGATIVE
Neisseria Gonorrhea: NEGATIVE

## 2014-10-17 ENCOUNTER — Encounter: Payer: Self-pay | Admitting: *Deleted

## 2014-10-23 ENCOUNTER — Ambulatory Visit (INDEPENDENT_AMBULATORY_CARE_PROVIDER_SITE_OTHER): Payer: Medicaid Other | Admitting: Obstetrics and Gynecology

## 2014-10-23 ENCOUNTER — Encounter: Payer: Self-pay | Admitting: Obstetrics and Gynecology

## 2014-10-23 VITALS — BP 112/80 | Ht 68.5 in | Wt 213.5 lb

## 2014-10-23 DIAGNOSIS — N8189 Other female genital prolapse: Secondary | ICD-10-CM | POA: Diagnosis not present

## 2014-10-23 NOTE — Progress Notes (Signed)
Patient ID: Mia Waters, female   DOB: 09-Mar-1981, 34 y.o.   MRN: [id number]  This chart was scribed for Tilda Burrow, MD by Carl Best, ED Scribe. This patient was seen in Room 2 and the patient's care was started at 10:46 AM.    [facility] Clinic Visit  Patient name: Mia Waters MRN [id number]  Date of birth: 03/06/1981  CC & HPI:  Mia Waters is a 34 y.o. female presenting today for waxing and waning pelvic pain - left side worse than right - and abdominal pain that started 2 years ago.  She had a copper IUD placed 3 years ago and states that her symptoms started a year after it was implanted.  She describes her menses as regular lasting only 5 days since she got an IUD.  Her last period was 25 days ago.  She has received an abdominal CT at [facility] on 08/23/2014 and at a hospital in [place], [place].  She has not had a pap smear in more than 3 years.  She lists vaginal discharge as an associated symptom. She had a "normal " exam at [facility], including GC/Chl for which pt has not received any results that she recalls. PT searching for explanation for what she perceives as more medical probs than ever before in her life in last few years. Recently discovered that her partner of 10 yrs is HEP C positive. Pt tested at [facility] found to be negative.  ROS:  All systems have been reviewed and are negative unless otherwise indicated in the HPI.   Pertinent History Reviewed:   Reviewed: Significant for umbilical hernia repair. Medical         Past Medical History  Diagnosis Date  . Asthma   . GERD (gastroesophageal reflux disease)   . H/O hiatal hernia   . History of migraine   . Gastritis                               Surgical Hx:    Past Surgical History  Procedure Laterality Date  . Mouth surgery    . Umbilical hernia repair N/A 04/03/2014    Procedure: HERNIA REPAIR UMBILICAL ;  Surgeon: Marlane Hatcher, MD;  Location: [facility];  Service: General;  Laterality: N/A;   Medications:  Reviewed & Updated - see associated section                       Current outpatient prescriptions:  .  albuterol (PROVENTIL HFA;VENTOLIN HFA) 108 (90 BASE) MCG/ACT inhaler, Inhale 2 puffs into the lungs every 4 (four) hours as needed for wheezing or shortness of breath. , Disp: , Rfl:  .  dicyclomine (BENTYL) 20 MG tablet, Take 20 mg by mouth every 6 (six) hours., Disp: , Rfl:  .  ketoprofen (ORUDIS) 75 MG capsule, Take 75 mg by mouth 4 (four) times daily as needed., Disp: , Rfl:  .  levonorgestrel (MIRENA) 20 MCG/24HR IUD, 1 each by Intrauterine route once., Disp: , Rfl:  .  omeprazole (PRILOSEC) 40 MG capsule, Take 40 mg by mouth daily., Disp: , Rfl:    Social History: Reviewed -  reports that she has been smoking Cigarettes.  She has a 10 pack-year smoking history. She has never used smokeless tobacco.  Objective Findings:  Vitals: Blood pressure 112/80, height 5' 8.5" (1.74 m), weight 213 lb 8 oz (96.843 kg), last  menstrual period 09/28/2014.  Physical Examination: General appearance - alert, well appearing, and in no distress, oriented to person, place, and time and overweight Abdomen - soft, nontender, nondistended, no masses or organomegaly Pelvic - normal external genitalia, vulva, vagina, cervix, uterus and adnexa,  VULVA: normal appearing vulva with no masses, tenderness or lesions,  VAGINA: normal appearing vagina with normal color and discharge, no lesions, NO suspicion of fistula. CERVIX: large appearing cervix without discharge or lesions, iud string present UTERUS: uterus is normal size, shape, consistency and nontender,  ADNEXA: normal adnexa in size, nontender and no masses  unable to reproduce pt's LLQ discomfort  Assessment & Plan:   A:  1. Mild pelvic relaxation due to multiparity.  P:  1recheck 4 wk with pain calendar and obtain pelvic u/s  Pt considering IUD removal, ive discouraged her from IUD removal.

## 2014-10-23 NOTE — Progress Notes (Signed)
Patient ID: Mia Waters, female   DOB: [birth year], 34 y.o.   MRN: [id number] Pt here today for pelvic pain and abdominal pain. Pt states that she has had the pain for about 2 years and is worse on her left side. Pt states that she did not have these problems until she got the IUD.

## 2014-10-24 NOTE — Addendum Note (Signed)
Addended by: Tilda BurrowFERGUSON, Aurora Rody V on: 10/24/2014 07:45 AM   Modules accepted: Level of Service

## 2014-11-17 ENCOUNTER — Other Ambulatory Visit: Payer: Self-pay | Admitting: Obstetrics and Gynecology

## 2014-11-17 DIAGNOSIS — R102 Pelvic and perineal pain: Secondary | ICD-10-CM

## 2014-11-19 ENCOUNTER — Encounter: Payer: Medicaid Other | Admitting: Women's Health

## 2014-11-19 ENCOUNTER — Other Ambulatory Visit: Payer: Medicaid Other

## 2014-11-19 ENCOUNTER — Encounter: Payer: Self-pay | Admitting: Obstetrics & Gynecology

## 2014-12-26 ENCOUNTER — Other Ambulatory Visit: Payer: Self-pay | Admitting: Obstetrics & Gynecology

## 2014-12-26 DIAGNOSIS — O3680X1 Pregnancy with inconclusive fetal viability, fetus 1: Secondary | ICD-10-CM

## 2014-12-30 ENCOUNTER — Ambulatory Visit (INDEPENDENT_AMBULATORY_CARE_PROVIDER_SITE_OTHER): Payer: Medicaid Other

## 2014-12-30 ENCOUNTER — Telehealth: Payer: Self-pay | Admitting: *Deleted

## 2014-12-30 DIAGNOSIS — O3680X1 Pregnancy with inconclusive fetal viability, fetus 1: Secondary | ICD-10-CM

## 2014-12-30 DIAGNOSIS — R102 Pelvic and perineal pain: Secondary | ICD-10-CM

## 2014-12-30 NOTE — Progress Notes (Signed)
PELVIC US TA/TV, normal anteverted uterus ,IUD w/in endometrial canal,normal ov's bilat,pain on lt side during ultrasound,ov's appear to be mobile

## 2015-01-05 ENCOUNTER — Ambulatory Visit (INDEPENDENT_AMBULATORY_CARE_PROVIDER_SITE_OTHER): Payer: Medicaid Other | Admitting: Obstetrics and Gynecology

## 2015-01-05 ENCOUNTER — Encounter: Payer: Self-pay | Admitting: Obstetrics and Gynecology

## 2015-01-05 VITALS — BP 140/98 | Ht 68.5 in | Wt 213.0 lb

## 2015-01-05 DIAGNOSIS — Z30432 Encounter for removal of intrauterine contraceptive device: Secondary | ICD-10-CM

## 2015-01-05 DIAGNOSIS — R1032 Left lower quadrant pain: Secondary | ICD-10-CM

## 2015-01-05 NOTE — Progress Notes (Signed)
Patient ID: Mia Waters, female   DOB: 1980-11-02, 34 y.o.   MRN: [id number] Pt here today for IUD removal. Pt does not want to discuss any other form of BC.

## 2015-01-05 NOTE — Progress Notes (Deleted)
Patient ID: Mia Waters, female   DOB: [birth year], 34 y.o.   MRN: [id number]    [facility] Clinic Visit  Patient name: Mia Waters MRN [id number]  Date of birth: August 20, 1980  CC & HPI:  Mia Waters is a 34 y.o. female presenting today for IUD removal, and discussion of chronic lower abd pain. She reports intermittent left-sided abdominal pain, mood swings and weight gain since having the IUD placed 2 years ago. Pt notes pain becomes worse with stress. She has been previously evaluated for her symptoms with no formal diagnosis. Pt was referred to GI for abdominal pain, but did not follow-up. Pt plans to use an ovulation calendar for birth control and condoms, as needed. She is not currently sexually active. Pt's last pap is out of date.  ROS:  A complete 10 system review of systems was obtained and all systems are negative except as noted in the HPI and PMH.   Pertinent History Reviewed:   Reviewed. Medical         Past Medical History  Diagnosis Date   Asthma    GERD (gastroesophageal reflux disease)    H/O hiatal hernia    History of migraine    Gastritis                               Surgical Hx:    Past Surgical History  Procedure Laterality Date   Mouth surgery     Umbilical hernia repair N/A 04/03/2014    Procedure: HERNIA REPAIR UMBILICAL ;  Surgeon: Marlane Hatcher, MD;  Location: [facility];  Service: General;  Laterality: N/A;   Medications: Reviewed & Updated - see associated section                       Current outpatient prescriptions:    albuterol (PROVENTIL HFA;VENTOLIN HFA) 108 (90 BASE) MCG/ACT inhaler, Inhale 2 puffs into the lungs every 4 (four) hours as needed for wheezing or shortness of breath. , Disp: , Rfl:    levonorgestrel (MIRENA) 20 MCG/24HR IUD, 1 each by Intrauterine route once., Disp: , Rfl:    omeprazole (PRILOSEC) 40 MG capsule, Take 40 mg by mouth daily., Disp: , Rfl:    Social History: Reviewed -  reports that she has been  smoking Cigarettes.  She has a 10 pack-year smoking history. She has never used smokeless tobacco.  Objective Findings:  Vitals: Blood pressure 140/98, height 5' 8.5" (1.74 m), weight 213 lb (96.616 kg), last menstrual period 12/19/2014.  Physical Examination: General appearance - alert, well appearing, and in no distress and oriented to person, place, and time Mental status - alert, oriented to person, place, and time, normal mood, behavior, speech, dress, motor activity, and thought processes  Family [name]  Mia Waters is a 34 y.o. [id number] here for Copper IUD removal. No GYN concerns.  Last pap smear was normal.  IUD Removal  Patient was in the dorsal lithotomy position, normal external genitalia was noted.  A speculum was placed in the patient's vagina, normal discharge was noted, no lesions. The multiparous cervix was visualized, no lesions, no abnormal discharge;  and the cervix was swabbed with Betadine using scopettes. The strings of the IUD were grasped and pulled using ring forceps.  The IUD was successfully removed in its entirety.    Patient tolerated the procedure well.  Future contraception: Condoms and ovulation timing as necessary   Assessment & Plan:   A:  1. Removal of IUD 2. Discussed birth control methods with pt which includes tracking ovulation and condom use 3 chronic LLQ pain associated with stressors P:  1. Pt to return in 4 weeks for a pap smear and re view of pain and menstrual calendar  This chart was scribed for Mia BurrowJohn Ferguson V, MD by Mia Waters, Medical Scribe. This patient was seen in room 2 and the patient's care was started at 3:42 PM.   I personally performed the services described in this documentation, which was SCRIBED in my presence. The recorded information has been reviewed and considered accurate. It has been edited as necessary during review. Mia BurrowFERGUSON,JOHN V, MD

## 2015-01-05 NOTE — Progress Notes (Signed)
Patient ID: Mia Waters, female   DOB: [birth year], 34 y.o.   MRN: [id number] Patient ID: Mia Waters, female   DOB: 03-09-1981, 34 y.o.   MRN: [id number]    [facility] Clinic Visit  Patient name: Mia Waters MRN [id number]  Date of birth: 11/25/1980  CC & HPI:  Mia Waters is a 34 y.o. female presenting today for IUD removal, and discussion of chronic lower abd pain. She reports intermittent left-sided abdominal pain, mood swings and weight gain since having the IUD placed 2 years ago. Pt notes pain becomes worse with stress. She has been previously evaluated for her symptoms with no formal diagnosis. Pt was referred to GI for abdominal pain, but did not follow-up. Pt plans to use an ovulation calendar for birth control and condoms, as needed. She is not currently sexually active. Pt's last pap is out of date.  ROS:  A complete 10 system review of systems was obtained and all systems are negative except as noted in the HPI and PMH.   Pertinent History Reviewed:   Reviewed. Medical         Past Medical History  Diagnosis Date  . Asthma   . GERD (gastroesophageal reflux disease)   . H/O hiatal hernia   . History of migraine   . Gastritis                               Surgical Hx:    Past Surgical History  Procedure Laterality Date  . Mouth surgery    . Umbilical hernia repair N/A 04/03/2014    Procedure: HERNIA REPAIR UMBILICAL ;  Surgeon: Marlane Hatcher, MD;  Location: [facility];  Service: General;  Laterality: N/A;   Medications: Reviewed & Updated - see associated section                       Current outpatient prescriptions:  .  albuterol (PROVENTIL HFA;VENTOLIN HFA) 108 (90 BASE) MCG/ACT inhaler, Inhale 2 puffs into the lungs every 4 (four) hours as needed for wheezing or shortness of breath. , Disp: , Rfl:  .  levonorgestrel (MIRENA) 20 MCG/24HR IUD, 1 each by Intrauterine route once., Disp: , Rfl:  .  omeprazole (PRILOSEC) 40 MG capsule, Take 40 mg by mouth daily.,  Disp: , Rfl:    Social History: Reviewed -  reports that she has been smoking Cigarettes.  She has a 10 pack-year smoking history. She has never used smokeless tobacco.  Objective Findings:  Vitals: Blood pressure 140/98, height 5' 8.5" (1.74 m), weight 213 lb (96.616 kg), last menstrual period 12/19/2014.  Physical Examination: General appearance - alert, well appearing, and in no distress and oriented to person, place, and time Mental status - alert, oriented to person, place, and time, normal mood, behavior, speech, dress, motor activity, and thought processes  Family [facility] CLINIC PROCEDURE NOTE  Mia Waters is a 34 y.o. [id number] here for Copper IUD removal. No GYN concerns.  Last pap smear was normal.  IUD Removal  Patient was in the dorsal lithotomy position, normal external genitalia was noted.  A speculum was placed in the patient's vagina, normal discharge was noted, no lesions. The multiparous cervix was visualized, no lesions, no abnormal discharge;  and the cervix was swabbed with Betadine using scopettes. The strings of the IUD were grasped and pulled using ring forceps.  The IUD was  successfully removed in its entirety.    Patient tolerated the procedure well.    Future contraception: Condoms and ovulation timing as necessary   Assessment & Plan:   A:  1. Removal of IUD 2. Discussed birth control methods with pt which includes tracking ovulation and condom use 3 chronic LLQ pain associated with stressors P:  1. Pt to return in 4 weeks for a pap smear and re view of pain and menstrual calendar  This chart was scribed for Tilda BurrowJohn Per Beagley V, MD by Gwenyth Oberatherine Macek, Medical Scribe. This patient was seen in room 2 and the patient's care was started at 3:42 PM.   I personally performed the services described in this documentation, which was SCRIBED in my presence. The recorded information has been reviewed and considered accurate. It has been edited as necessary during  review. Tilda BurrowFERGUSON,Courtni Balash V, MD

## 2015-01-27 ENCOUNTER — Telehealth: Payer: Self-pay | Admitting: Obstetrics and Gynecology

## 2015-01-27 NOTE — Telephone Encounter (Signed)
Unable to reach pt at this number. Will send recall request.

## 2015-02-02 ENCOUNTER — Other Ambulatory Visit: Payer: Medicaid Other | Admitting: Obstetrics and Gynecology

## 2015-02-16 ENCOUNTER — Encounter: Payer: Self-pay | Admitting: Obstetrics and Gynecology

## 2015-02-16 ENCOUNTER — Other Ambulatory Visit: Payer: Medicaid Other | Admitting: Obstetrics and Gynecology

## 2015-06-10 ENCOUNTER — Encounter: Payer: Self-pay | Admitting: Internal Medicine

## 2015-07-02 ENCOUNTER — Ambulatory Visit (INDEPENDENT_AMBULATORY_CARE_PROVIDER_SITE_OTHER): Payer: Medicaid Other | Admitting: Nurse Practitioner

## 2015-07-02 ENCOUNTER — Encounter: Payer: Self-pay | Admitting: Nurse Practitioner

## 2015-07-02 VITALS — BP 146/82 | HR 93 | Temp 97.5°F | Ht 68.0 in | Wt 238.4 lb

## 2015-07-02 DIAGNOSIS — K219 Gastro-esophageal reflux disease without esophagitis: Secondary | ICD-10-CM

## 2015-07-02 DIAGNOSIS — R1032 Left lower quadrant pain: Secondary | ICD-10-CM

## 2015-07-02 MED ORDER — PANTOPRAZOLE SODIUM 40 MG PO TBEC: 40.0000 mg | DELAYED_RELEASE_TABLET | Freq: Every day | ORAL | Status: DC

## 2015-07-02 MED ORDER — LINACLOTIDE 145 MCG PO CAPS: 145.0000 ug | ORAL_CAPSULE | Freq: Every day | ORAL | Status: DC

## 2015-07-02 NOTE — Progress Notes (Signed)
Primary Care Physician:  Phyllis Ginger Primary Gastroenterologist:  Dr. Jena Gauss  Chief Complaint  Patient presents with  . Abdominal Pain    HPI:   35 year old female presents on referral from PCP for left lower quadrant abdominal pain. PCP notes reviewed, ER notes reviewed, and imaging studies reviewed. Last saw PCP 06/04/2015 for left lower quadrant abdominal and left flank pain for the past 2 years which is been getting progressively worse. Multiple ED visits the last one being 2 months ago. Her complains that her PCP visit include bloating as well. No triggers or things that alleviate her symptoms. Denies fever, chills. Thought it could be her IUD but she had removed by gynecology with no improvement in symptoms. She denied constipation or diarrhea at that time but did note some pale stools. She does still have a gallbladder, no hepatic disease that she is aware of. Last system emergency room visit 10/23/2014 for similar symptoms no acute findings, patient discharged home on pain medication. CT done 08/23/2014 found no acute abdominal or pelvic findings, normal appendix, IUD expected location. Transvaginal ultrasound 12/31/2014 found normal pelvic anatomy with IUD in proper place, then, suppressed endometrial stripe. Last provided by PCP showed normal kidney function, normal liver function (AST/ALT 10/11, bili total 0.4), amylase normal, lipase normal. Her white blood cell count was mildly elevated at 12.5. UA was normal.  Today she states her LLQ pain has been going on for about 2 years, intermittent, becoming more progressive and frequent. 2.5 years ago had a sudden pain and it has been going since then. Currently occurs about every day and lasts until she takes Tylenol, which helps but does not resolve the pain. Ibuprofen and home remedies do not work. Desbribed as a cramping/throbbing. Has a bowel movement "not very regularly" and about every 4 days, hard stools, straining, incomplete  emptying. Pain not much improved after a bowel movement. Rarely has diarrhea. Has noticed some scant toilet tissue hematochezia, does have hemorrhoids which are otherwise asymptomatic. Rare nausea. Does have fever, unintentional weight loss. Quit smoking recently due to dyspnea. Also having GERD symptoms, Prilosec not helping anymore, has been on it for over 9 years. Symptoms include bitter taste, esophageal burning, epigastric pain. Denies chest pain, dizziness, lightheadedness, syncope, near syncope. Denies any other upper or lower GI symptoms.  Past Medical History  Diagnosis Date  . Asthma   . GERD (gastroesophageal reflux disease)   . H/O hiatal hernia   . History of migraine   . Gastritis     Past Surgical History  Procedure Laterality Date  . Mouth surgery    . Umbilical hernia repair N/A 04/03/2014    Procedure: HERNIA REPAIR UMBILICAL ;  Surgeon: Marlane Hatcher, MD;  Location: [facility];  Service: General;  Laterality: N/A;    Current Outpatient Prescriptions  Medication Sig Dispense Refill  . acetaminophen (TYLENOL) 325 MG tablet Take 650 mg by mouth every 6 (six) hours as needed.    Marland Kitchen albuterol (PROVENTIL HFA;VENTOLIN HFA) 108 (90 BASE) MCG/ACT inhaler Inhale 2 puffs into the lungs every 4 (four) hours as needed for wheezing or shortness of breath.     Marland Kitchen omeprazole (PRILOSEC) 40 MG capsule Take 40 mg by mouth daily.    Marland Kitchen levonorgestrel (MIRENA) 20 MCG/24HR IUD 1 each by Intrauterine route once. Reported on 07/02/2015     No current facility-administered medications for this visit.    Allergies as of 07/02/2015  . (No Known Allergies)  Family History  Problem Relation Age of Onset  . Diabetes Mother   . Hypertension Mother   . Cancer Other   . Heart disease Other     grandfather  . Alzheimer's disease Other     grandmother  . Heart disease Father     congenital  . Healthy Brother   . Asthma Daughter   . Healthy Daughter   . Healthy Son   . Healthy Brother   .  Healthy Son   . Healthy Daughter   . Healthy Daughter     Social History   Social History  . Marital Status: Single    Spouse Name: N/A  . Number of Children: N/A  . Years of Education: N/A   Occupational History  . Not on file.   Social History Main Topics  . Smoking status: Former Smoker -- 0.50 packs/day for 20 years    Types: Cigarettes    Quit date: 05/18/2015  . Smokeless tobacco: Never Used  . Alcohol Use: 0.6 oz/week    1 Glasses of wine per week     Comment: 2-4 times per month  . Drug Use: No  . Sexual Activity: Not Currently    Birth Control/ Protection: IUD   Other Topics Concern  . Not on file   Social History Narrative    Review of Systems: General: Negative for anorexia, weight loss, fever, fatigue, weakness. Eyes: Negative for vision changes.  ENT: Negative for hoarseness, difficulty swallowing. CV: Negative for chest pain, angina, palpitations, peripheral edema.  Respiratory: Negative for dyspnea at rest, worsening cough, sputum, wheezing.  GI: See history of present illness. Derm: Negative for rash or itching.  Endo: Negative for unusual weight change.  Heme: Negative for bruising or bleeding. Allergy: Negative for rash or hives.    Physical Exam: BP 146/82 mmHg  Pulse 93  Temp(Src) 97.5 F (36.4 C)  Ht 5\' 8"  (1.727 m)  Wt 238 lb 6.4 oz (108.138 kg)  BMI 36.26 kg/m2  LMP 05/12/2015 General:   Alert and oriented. Pleasant and cooperative. Well-nourished and well-developed.  Head:  Normocephalic and atraumatic. Eyes:  Without icterus, sclera clear and conjunctiva pink.  Ears:  Normal auditory acuity. Cardiovascular:  S1, S2 present without murmurs appreciated. Normal DP pulses noted. Extremities without clubbing or edema. Respiratory:  Clear to auscultation bilaterally. No wheezes, rales, or rhonchi. No distress.  Gastrointestinal:  +BS, soft, and non-distended. Epigastric and LLQ TTP. No HSM noted. No guarding or rebound. No masses  appreciated.  Rectal:  Deferred  Musculoskalatal:  Symmetrical without gross deformities. Normal posture. Skin:  Intact without significant lesions or rashes. Neurologic:  Alert and oriented x4;  grossly normal neurologically. Psych:  Alert and cooperative. Normal mood and affect.    07/02/2015 8:40 AM

## 2015-07-02 NOTE — Patient Instructions (Signed)
1. Stop taking Prilosec. 2. Start taking Protonix 40 mg once a day, 30 minutes before your first meal the day. 3. Start taking Linzess 145 g once daily on an empty stomach. 4. Have your x-ray done when you're able to. 5. Return for follow-up in 3 months.

## 2015-07-06 NOTE — Assessment & Plan Note (Signed)
Asian currently having persistent GERD symptoms on Prilosec. We will have her stop Prilosec and start Protonix 40 mg once a day which she was previously on and found to be effective. Server follow-up in 3 months for effectiveness.

## 2015-07-06 NOTE — Assessment & Plan Note (Addendum)
She with chronic left lower quadrant abdominal pain which is been going on for approximately 2-1/2 years. She does have noted constipation with incomplete emptying, hard stools, straining and only has a bowel movement about every 4 days. Today we will order an abdominal x-ray to evaluate for stool burden. We'll also have her start Linzess 145 g. We will give her samples to last a couple weeks to trial for effectiveness and send in a prescription if it is effective. She is been requested to call us and let us know if the medication helps her. Return for follow-up in 3 months. She has noted some scant toilet tissue hematochezia with a known history of hemorrhoids. We will reassess this at 3 months and if continuing to have hematochezia despite constipation control will likely need colonoscopy to further evaluate.. She is most likely having benign anorectal bleeding, specifically from her hemorrhoids, given her constipation, hard stools, and needed straining. If we can get her constipation controlled the bleeding may resolve itself. No known family history of colon cancer.

## 2015-07-07 NOTE — Progress Notes (Signed)
cc'ed to pcp °

## 2015-09-30 ENCOUNTER — Ambulatory Visit: Payer: Medicaid Other | Admitting: Nurse Practitioner

## 2015-09-30 ENCOUNTER — Encounter: Payer: Self-pay | Admitting: Nurse Practitioner

## 2018-03-16 ENCOUNTER — Other Ambulatory Visit: Payer: Self-pay

## 2018-03-16 ENCOUNTER — Emergency Department (HOSPITAL_COMMUNITY)
Admission: EM | Admit: 2018-03-16 | Discharge: 2018-03-16 | Disposition: A | Discharge status: Home / Self Care | Payer: Medicaid Other | Attending: Emergency Medicine | Admitting: Emergency Medicine

## 2018-03-16 ENCOUNTER — Emergency Department (HOSPITAL_COMMUNITY): Payer: Medicaid Other

## 2018-03-16 ENCOUNTER — Encounter (HOSPITAL_COMMUNITY): Payer: Self-pay | Admitting: Emergency Medicine

## 2018-03-16 DIAGNOSIS — O418X10 Other specified disorders of amniotic fluid and membranes, first trimester, not applicable or unspecified: Secondary | ICD-10-CM

## 2018-03-16 DIAGNOSIS — G44209 Tension-type headache, unspecified, not intractable: Secondary | ICD-10-CM

## 2018-03-16 DIAGNOSIS — O99511 Diseases of the respiratory system complicating pregnancy, first trimester: Secondary | ICD-10-CM | POA: Insufficient documentation

## 2018-03-16 DIAGNOSIS — O9989 Other specified diseases and conditions complicating pregnancy, childbirth and the puerperium: Secondary | ICD-10-CM | POA: Diagnosis not present

## 2018-03-16 DIAGNOSIS — O4690 Antepartum hemorrhage, unspecified, unspecified trimester: Secondary | ICD-10-CM

## 2018-03-16 DIAGNOSIS — O468X1 Other antepartum hemorrhage, first trimester: Secondary | ICD-10-CM | POA: Insufficient documentation

## 2018-03-16 DIAGNOSIS — O208 Other hemorrhage in early pregnancy: Secondary | ICD-10-CM | POA: Diagnosis present

## 2018-03-16 DIAGNOSIS — Z3A10 10 weeks gestation of pregnancy: Secondary | ICD-10-CM | POA: Insufficient documentation

## 2018-03-16 DIAGNOSIS — R51 Headache: Secondary | ICD-10-CM | POA: Diagnosis not present

## 2018-03-16 DIAGNOSIS — J45909 Unspecified asthma, uncomplicated: Secondary | ICD-10-CM | POA: Diagnosis not present

## 2018-03-16 DIAGNOSIS — O23591 Infection of other part of genital tract in pregnancy, first trimester: Secondary | ICD-10-CM | POA: Diagnosis not present

## 2018-03-16 DIAGNOSIS — N760 Acute vaginitis: Secondary | ICD-10-CM

## 2018-03-16 DIAGNOSIS — Z87891 Personal history of nicotine dependence: Secondary | ICD-10-CM | POA: Diagnosis not present

## 2018-03-16 DIAGNOSIS — B9689 Other specified bacterial agents as the cause of diseases classified elsewhere: Secondary | ICD-10-CM

## 2018-03-16 LAB — URINALYSIS, ROUTINE W REFLEX MICROSCOPIC
Bilirubin Urine: NEGATIVE
Glucose, UA: NEGATIVE mg/dL
Hgb urine dipstick: NEGATIVE
Ketones, ur: 5 mg/dL — AB
Leukocytes, UA: NEGATIVE
Nitrite: NEGATIVE
Protein, ur: NEGATIVE mg/dL
Specific Gravity, Urine: 1.018 (ref 1.005–1.030)
pH: 6 (ref 5.0–8.0)

## 2018-03-16 LAB — COMPREHENSIVE METABOLIC PANEL
ALT: 19 U/L (ref 0–44)
AST: 16 U/L (ref 15–41)
Albumin: 4.4 g/dL (ref 3.5–5.0)
Alkaline Phosphatase: 42 U/L (ref 38–126)
Anion gap: 13 (ref 5–15)
BUN: 7 mg/dL (ref 6–20)
CO2: 23 mmol/L (ref 22–32)
Calcium: 9.2 mg/dL (ref 8.9–10.3)
Chloride: 101 mmol/L (ref 98–111)
Creatinine, Ser: 0.61 mg/dL (ref 0.44–1.00)
GFR calc Af Amer: >60 mL/min (ref >60)
GFR calc non Af Amer: >60 mL/min (ref >60)
Glucose, Bld: 81 mg/dL (ref 70–99)
Potassium: 3.3 mmol/L — ABNORMAL LOW (ref 3.5–5.1)
Sodium: 137 mmol/L (ref 135–145)
Total Bilirubin: 0.4 mg/dL (ref 0.3–1.2)
Total Protein: 7.6 g/dL (ref 6.5–8.1)

## 2018-03-16 LAB — WET PREP, GENITAL
Sperm: NONE SEEN
Trich, Wet Prep: NONE SEEN
Yeast Wet Prep HPF POC: NONE SEEN

## 2018-03-16 LAB — CBC WITH DIFFERENTIAL/PLATELET
Basophils Absolute: 0.1 10*3/uL (ref 0.0–0.1)
Basophils Relative: 0 %
Eosinophils Absolute: 0.3 10*3/uL (ref 0.0–0.7)
Eosinophils Relative: 2 %
HCT: 37.8 % (ref 36.0–46.0)
Hemoglobin: 12.5 g/dL (ref 12.0–15.0)
Lymphocytes Relative: 28 %
Lymphs Abs: 4.5 10*3/uL — ABNORMAL HIGH (ref 0.7–4.0)
MCH: 31.7 pg (ref 26.0–34.0)
MCHC: 33.1 g/dL (ref 30.0–36.0)
MCV: 95.9 fL (ref 78.0–100.0)
Monocytes Absolute: 0.9 10*3/uL (ref 0.1–1.0)
Monocytes Relative: 6 %
Neutro Abs: 10.1 10*3/uL — ABNORMAL HIGH (ref 1.7–7.7)
Neutrophils Relative %: 64 %
Platelets: 431 10*3/uL — ABNORMAL HIGH (ref 150–400)
RBC: 3.94 MIL/uL (ref 3.87–5.11)
RDW: 14.4 % (ref 11.5–15.5)
WBC: 15.9 10*3/uL — ABNORMAL HIGH (ref 4.0–10.5)

## 2018-03-16 LAB — PREGNANCY, URINE: Preg Test, Ur: POSITIVE — AB

## 2018-03-16 LAB — ABO/RH: ABO/RH(D): A POS

## 2018-03-16 LAB — HCG, QUANTITATIVE, PREGNANCY: hCG, Beta Chain, Quant, S: 31051 m[IU]/mL — ABNORMAL HIGH (ref <5)

## 2018-03-16 MED ORDER — SODIUM CHLORIDE 0.9 % IV BOLUS
1000.0000 mL | Freq: Once | INTRAVENOUS | Status: AC
  Administered 2018-03-16: 1000 mL via INTRAVENOUS

## 2018-03-16 MED ORDER — METRONIDAZOLE 500 MG PO TABS: 500.0000 mg | ORAL_TABLET | Freq: Two times a day (BID) | ORAL | 0 refills | Status: AC

## 2018-03-16 NOTE — Discharge Instructions (Addendum)
You were seen in the ED today with vaginal bleeding in pregnancy. You have a single, live pregnancy on ultrasound measuring 10 weeks. Call your OB or the OB listed to establish care. Your bleeding should resolve and there is no evidence at this time that your pregnancy is in danger. Take Tylenol as needed for HA. I am treating you for bacterial vaginosis. Do not drink alcohol while pregnant or while taking Flagyl as this can cause abdominal pain and vomiting. Return to the ED with any worsening pain, fever, chills, worsening bleeding, trouble breathing, or lightheadedness.

## 2018-03-16 NOTE — ED Triage Notes (Signed)
Pt is c/o of vaginal bleeding "spotting" x 2 days and a headache. Denies any new injuries or abdominal cramping.

## 2018-03-16 NOTE — ED Provider Notes (Signed)
Emergency Department Provider Note   I have reviewed the triage vital signs and the nursing notes.   HISTORY  Chief Complaint Vaginal Bleeding and Headache   HPI Mia Waters is a 37 y.o. female G23P5 with is 10 wks by dates with PMH of HTN, asthma, GERD, and migraine history presents to the emergency department for evaluation of vaginal spotting for the past 2 days with associated headache.  Patient has had 3 prior miscarriages which began in a similar fashion.  Her headache is mostly frontal and a typical of her migraine headaches.  No sudden onset, maximal intensity symptoms.  No vision change, numbness/weakness.  She tried taking Tylenol at home with no relief in symptoms.  She is taken a home pregnancy test which was positive and based on her last menstrual period calculated [redacted] weeks pregnant. She has not seen OB. Vaginal bleeding and some cramping began two days prior. Cramping has stopped and bleeding is reduced but continues to have some mild spotting today. No passage of tissue or clots. No lightheadedness or near-syncope events. LMP July 8th.    Past Medical History:  Diagnosis Date  . Asthma   . Gastritis   . GERD (gastroesophageal reflux disease)   . H/O hiatal hernia   . History of migraine     Patient Active Problem List   Diagnosis Date Noted  . GERD (gastroesophageal reflux disease) 07/02/2015  . LLQ abdominal pain 01/05/2015  . Encounter for IUD removal 01/05/2015  . Umbilical hernia, incarcerated 04/03/2014    Past Surgical History:  Procedure Laterality Date  . MOUTH SURGERY    . UMBILICAL HERNIA REPAIR N/A 04/03/2014   Procedure: HERNIA REPAIR UMBILICAL ;  Surgeon: Marlane Hatcher, MD;  Location: [facility];  Service: General;  Laterality: N/A;   Allergies Patient has no known allergies.  Family History  Problem Relation Age of Onset  . Diabetes Mother   . Hypertension Mother   . Heart disease Father        congenital  . Healthy Brother   .  Asthma Daughter   . Healthy Daughter   . Healthy Son   . Healthy Brother   . Healthy Son   . Healthy Daughter   . Healthy Daughter   . Cancer Other   . Heart disease Other        grandfather  . Alzheimer's disease Other        grandmother  . Colon cancer Neg Hx     Social History Social History   Tobacco Use  . Smoking status: Former Smoker    Packs/day: 0.50    Years: 20.00    Pack years: 10.00    Types: Cigarettes    Last attempt to quit: 05/18/2015    Years since quitting: 2.8  . Smokeless tobacco: Never Used  Substance Use Topics  . Alcohol use: Yes    Alcohol/week: 1.0 standard drinks    Types: 1 Glasses of wine per week    Comment: 2-4 times per month  . Drug use: No    Review of Systems  Constitutional: No fever/chills Eyes: No visual changes. ENT: No sore throat. Cardiovascular: Denies chest pain. Respiratory: Denies shortness of breath. Gastrointestinal: Cramping lower abdominal pain (resolved). No nausea, no vomiting.  No diarrhea.  No constipation. Genitourinary: Negative for dysuria. Vaginal bleeding/spotting x 2 days.  Musculoskeletal: Negative for back pain. Skin: Negative for rash. Neurological: Negative for focal weakness or numbness. Positive HA.   10-point ROS  otherwise negative.  ____________________________________________   PHYSICAL EXAM:  VITAL SIGNS: ED Triage Vitals  Enc Vitals Group     BP 03/16/18 1013 (!) 150/92     Pulse Rate 03/16/18 1013 (!) 113     Resp 03/16/18 1013 16     Temp 03/16/18 1013 97.8 F (36.6 C)     Temp Source 03/16/18 1013 Oral     SpO2 03/16/18 1013 100 %     Weight 03/16/18 1012 208 lb (94.3 kg)     Height 03/16/18 1012 5\' 8"  (1.727 m)     Pain Score 03/16/18 1012 9   Constitutional: Alert and oriented. Well appearing and in no acute distress. Eyes: Conjunctivae are normal.  Head: Atraumatic. Nose: No congestion/rhinnorhea. Mouth/Throat: Mucous membranes are moist.  Neck: No stridor.    Cardiovascular: Tachycardia. Good peripheral circulation. 3/6 systolic heart murmur.    Respiratory: Normal respiratory effort.  No retractions. Lungs CTAB. Gastrointestinal: Soft and nontender. No distention.  Genitourinary: Nurse chaperone present for pelvic exam. Normal external genitalia. No appreciable vaginal bleeding. Visually closed cervix. Mild/moderate white discharge.  Musculoskeletal: No lower extremity tenderness nor edema. No gross deformities of extremities. Neurologic:  Normal speech and language. No gross focal neurologic deficits are appreciated.  Skin:  Skin is warm, dry and intact. No rash noted.  ____________________________________________   LABS (all labs ordered are listed, but only abnormal results are displayed)  Labs Reviewed  WET PREP, GENITAL - Abnormal; Notable for the following components:      Result Value   Clue Cells Wet Prep HPF POC PRESENT (*)    WBC, Wet Prep HPF POC FEW (*)    All other components within normal limits  COMPREHENSIVE METABOLIC PANEL - Abnormal; Notable for the following components:   Potassium 3.3 (*)    All other components within normal limits  CBC WITH DIFFERENTIAL/PLATELET - Abnormal; Notable for the following components:   WBC 15.9 (*)    Platelets 431 (*)    Neutro Abs 10.1 (*)    Lymphs Abs 4.5 (*)    All other components within normal limits  HCG, QUANTITATIVE, PREGNANCY - Abnormal; Notable for the following components:   hCG, Beta Chain, Quant, S 31,051 (*)    All other components within normal limits  PREGNANCY, URINE - Abnormal; Notable for the following components:   Preg Test, Ur POSITIVE (*)    All other components within normal limits  URINALYSIS, ROUTINE W REFLEX MICROSCOPIC - Abnormal; Notable for the following components:   Ketones, ur 5 (*)    All other components within normal limits  ABO/RH  GC/CHLAMYDIA PROBE AMP ([place]) NOT AT [facility]    ____________________________________________  RADIOLOGY  US Ob Comp < 14 Wks  Result Date: 03/16/2018 CLINICAL DATA:  Early pregnancy, vaginal bleeding and cramping. Beta HCG level 31,051 EXAM: OBSTETRIC <14 WK [place] AND TRANSVAGINAL OB US TECHNIQUE: Both transabdominal and transvaginal ultrasound examinations were performed for complete evaluation of the gestation as well as the maternal uterus, adnexal regions, and pelvic cul-de-sac. Transvaginal technique was performed to assess early pregnancy. COMPARISON:  None. FINDINGS: Intrauterine gestational sac: Present Yolk sac:  Present Embryo:  Present Cardiac Activity: Present Heart Rate: 171 bpm CRL: 31 mm   10 w   0 d                  [place] EDC: 10/12/2018 Subchorionic hemorrhage: Small amount of subchorionic hemorrhage for example suggested on image 85 and on image 45. Maternal  uterus/adnexae: Right corpus luteum 1.8 cm. Nonvisualization of the left ovary due to bowel. No maternal free fluid IMPRESSION: 1. Single living intrauterine pregnancy measuring at 10 weeks 0 days to station. 2. Small amount of subchorionic hemorrhage. Electronically Signed   By: Gaylyn Rong M.D.   On: 03/16/2018 12:57   US Ob Transvaginal  Result Date: 03/16/2018 CLINICAL DATA:  Early pregnancy, vaginal bleeding and cramping. Beta HCG level 31,051 EXAM: OBSTETRIC <14 WK [place] AND TRANSVAGINAL OB US TECHNIQUE: Both transabdominal and transvaginal ultrasound examinations were performed for complete evaluation of the gestation as well as the maternal uterus, adnexal regions, and pelvic cul-de-sac. Transvaginal technique was performed to assess early pregnancy. COMPARISON:  None. FINDINGS: Intrauterine gestational sac: Present Yolk sac:  Present Embryo:  Present Cardiac Activity: Present Heart Rate: 171 bpm CRL: 31 mm   10 w   0 d                  [place] EDC: 10/12/2018 Subchorionic hemorrhage: Small amount of subchorionic hemorrhage for example suggested on image 85 and on image 45.  Maternal uterus/adnexae: Right corpus luteum 1.8 cm. Nonvisualization of the left ovary due to bowel. No maternal free fluid IMPRESSION: 1. Single living intrauterine pregnancy measuring at 10 weeks 0 days to station. 2. Small amount of subchorionic hemorrhage. Electronically Signed   By: Gaylyn Rong M.D.   On: 03/16/2018 12:57    ____________________________________________   PROCEDURES  Procedure(s) performed:   Procedures  None ____________________________________________   INITIAL IMPRESSION / ASSESSMENT AND PLAN / ED COURSE  Pertinent labs & imaging results that were available during my care of the patient were reviewed by me and considered in my medical decision making (see chart for details).  Patient presents to the emergency department with vaginal spotting for the past 2 days with associated headache.  She has no neuro deficits.  Atypical for her migraine.  Patient is slightly tachycardic without fever.  Has known history of hypertension.  Abdomen is soft and nontender.  Cervix is visually closed with no appreciable bleeding.  Plan for quantitative hCG, pelvic ultrasound, IV fluids, and reassess.  Considered venous sinus thrombosis as a possibility but with normal exam and history of that is nonsupportive feel this is less likely.   1:13 PM Ultrasound reviewed shows live intrauterine pregnancy at 10 weeks.  Patient does have a very small subchorionic hemorrhage.  No active bleeding at this time.  No abdominal pain.  Plan for OB follow-up and advised patient take Tylenol as needed for headache.  Patient does have some evidence of bacterial vaginosis on wet prep.  She is having some mild symptoms in early pregnancy so plan on treating with Flagyl.   At this time, I do not feel there is any life-threatening condition present. I have reviewed and discussed all results (EKG, imaging, lab, urine as appropriate), exam findings with patient. I have reviewed nursing notes and  appropriate previous records.  I feel the patient is safe to be discharged home without further emergent workup. Discussed usual and customary return precautions. Patient and family (if present) verbalize understanding and are comfortable with this plan.  Patient will follow-up with their primary care provider. If they do not have a primary care provider, information for follow-up has been provided to them. All questions have been answered.   ____________________________________________  FINAL CLINICAL IMPRESSION(S) / ED DIAGNOSES  Final diagnoses:  Vaginal bleeding in pregnancy  Acute non intractable tension-type headache  BV (bacterial vaginosis)  Subchorionic hematoma in first trimester, single or unspecified fetus     MEDICATIONS GIVEN DURING THIS VISIT:  Medications  sodium chloride 0.9 % bolus 1,000 mL (0 mLs Intravenous Stopped 03/16/18 1208)     NEW OUTPATIENT MEDICATIONS STARTED DURING THIS VISIT:  New Prescriptions   METRONIDAZOLE (FLAGYL) 500 MG TABLET    Take 1 tablet (500 mg total) by mouth 2 (two) times daily for 7 days.    Note:  This document was prepared using Dragon voice recognition software and may include unintentional dictation errors.  Alona BeneJoshua Joeanthony Seeling, MD Emergency Medicine    Korie Streat, Arlyss RepressJoshua G, MD 03/16/18 1314

## 2018-03-19 LAB — GC/CHLAMYDIA PROBE AMP (~~LOC~~) NOT AT ARMC
Chlamydia: NEGATIVE
Neisseria Gonorrhea: NEGATIVE

## 2018-07-04 ENCOUNTER — Other Ambulatory Visit (HOSPITAL_COMMUNITY): Payer: Self-pay | Admitting: Neurology

## 2018-07-04 ENCOUNTER — Encounter (HOSPITAL_COMMUNITY): Payer: Self-pay

## 2018-07-04 ENCOUNTER — Ambulatory Visit (HOSPITAL_COMMUNITY)
Admission: RE | Admit: 2018-07-04 | Discharge: 2018-07-04 | Disposition: A | Discharge status: Home / Self Care | Payer: Medicaid Other | Source: Ambulatory Visit | Attending: Neurology | Admitting: Neurology

## 2018-07-04 DIAGNOSIS — M545 Low back pain, unspecified: Secondary | ICD-10-CM

## 2018-07-04 DIAGNOSIS — M542 Cervicalgia: Secondary | ICD-10-CM | POA: Diagnosis present

## 2018-07-25 ENCOUNTER — Other Ambulatory Visit (HOSPITAL_COMMUNITY): Payer: Self-pay | Admitting: Neurology

## 2018-07-25 ENCOUNTER — Other Ambulatory Visit: Payer: Self-pay | Admitting: Neurology

## 2018-07-25 DIAGNOSIS — M545 Low back pain, unspecified: Secondary | ICD-10-CM

## 2018-08-06 ENCOUNTER — Encounter (HOSPITAL_COMMUNITY): Payer: Self-pay

## 2018-08-06 ENCOUNTER — Ambulatory Visit (HOSPITAL_COMMUNITY): Payer: Medicaid Other

## 2018-08-29 ENCOUNTER — Ambulatory Visit (HOSPITAL_COMMUNITY)
Admission: RE | Admit: 2018-08-29 | Discharge: 2018-08-29 | Disposition: A | Discharge status: Home / Self Care | Payer: Medicaid Other | Source: Ambulatory Visit | Attending: Neurology | Admitting: Neurology

## 2018-08-29 DIAGNOSIS — M545 Low back pain, unspecified: Secondary | ICD-10-CM

## 2018-09-04 ENCOUNTER — Other Ambulatory Visit: Payer: Self-pay | Admitting: Neurology

## 2018-09-04 DIAGNOSIS — M545 Low back pain, unspecified: Secondary | ICD-10-CM

## 2018-09-04 DIAGNOSIS — G8929 Other chronic pain: Secondary | ICD-10-CM

## 2018-09-13 ENCOUNTER — Other Ambulatory Visit: Payer: Self-pay | Admitting: Neurology

## 2018-09-13 ENCOUNTER — Other Ambulatory Visit (HOSPITAL_COMMUNITY): Payer: Self-pay | Admitting: Neurology

## 2018-09-13 DIAGNOSIS — M542 Cervicalgia: Secondary | ICD-10-CM

## 2018-12-26 ENCOUNTER — Ambulatory Visit (HOSPITAL_COMMUNITY): Admission: RE | Admit: 2018-12-26 | Payer: Medicaid Other | Source: Ambulatory Visit

## 2018-12-26 ENCOUNTER — Encounter (HOSPITAL_COMMUNITY): Payer: Self-pay

## 2019-01-02 ENCOUNTER — Ambulatory Visit (HOSPITAL_COMMUNITY): Payer: Medicaid Other

## 2019-01-02 ENCOUNTER — Encounter (HOSPITAL_COMMUNITY): Payer: Self-pay

## 2019-01-04 ENCOUNTER — Other Ambulatory Visit: Payer: Self-pay

## 2019-01-04 ENCOUNTER — Ambulatory Visit (HOSPITAL_COMMUNITY)
Admission: RE | Admit: 2019-01-04 | Discharge: 2019-01-04 | Disposition: A | Discharge status: Home / Self Care | Payer: Medicaid Other | Source: Ambulatory Visit | Attending: Neurology | Admitting: Neurology

## 2019-01-04 DIAGNOSIS — M542 Cervicalgia: Secondary | ICD-10-CM | POA: Diagnosis present

## 2019-11-11 DIAGNOSIS — G8929 Other chronic pain: Secondary | ICD-10-CM | POA: Insufficient documentation

## 2019-11-11 DIAGNOSIS — M542 Cervicalgia: Secondary | ICD-10-CM | POA: Insufficient documentation

## 2019-11-11 DIAGNOSIS — O10919 Unspecified pre-existing hypertension complicating pregnancy, unspecified trimester: Secondary | ICD-10-CM | POA: Insufficient documentation

## 2020-04-13 ENCOUNTER — Other Ambulatory Visit: Payer: Self-pay | Admitting: Obstetrics & Gynecology

## 2020-04-13 DIAGNOSIS — O3680X0 Pregnancy with inconclusive fetal viability, not applicable or unspecified: Secondary | ICD-10-CM

## 2020-04-14 ENCOUNTER — Ambulatory Visit: Payer: Medicaid Other | Admitting: *Deleted

## 2020-04-14 ENCOUNTER — Encounter: Payer: Self-pay | Admitting: Women's Health

## 2020-04-14 ENCOUNTER — Other Ambulatory Visit: Payer: Self-pay

## 2020-04-14 ENCOUNTER — Other Ambulatory Visit: Payer: Self-pay | Admitting: Obstetrics & Gynecology

## 2020-04-14 ENCOUNTER — Ambulatory Visit (INDEPENDENT_AMBULATORY_CARE_PROVIDER_SITE_OTHER): Payer: Medicaid Other

## 2020-04-14 ENCOUNTER — Ambulatory Visit (INDEPENDENT_AMBULATORY_CARE_PROVIDER_SITE_OTHER): Payer: Medicaid Other | Admitting: Women's Health

## 2020-04-14 VITALS — BP 152/90 | HR 102 | Wt 260.0 lb

## 2020-04-14 DIAGNOSIS — Z363 Encounter for antenatal screening for malformations: Secondary | ICD-10-CM

## 2020-04-14 DIAGNOSIS — Z1389 Encounter for screening for other disorder: Secondary | ICD-10-CM | POA: Diagnosis not present

## 2020-04-14 DIAGNOSIS — O0930 Supervision of pregnancy with insufficient antenatal care, unspecified trimester: Secondary | ICD-10-CM | POA: Insufficient documentation

## 2020-04-14 DIAGNOSIS — O0990 Supervision of high risk pregnancy, unspecified, unspecified trimester: Secondary | ICD-10-CM | POA: Insufficient documentation

## 2020-04-14 DIAGNOSIS — O0992 Supervision of high risk pregnancy, unspecified, second trimester: Secondary | ICD-10-CM

## 2020-04-14 DIAGNOSIS — O3680X0 Pregnancy with inconclusive fetal viability, not applicable or unspecified: Secondary | ICD-10-CM

## 2020-04-14 DIAGNOSIS — Z3A18 18 weeks gestation of pregnancy: Secondary | ICD-10-CM

## 2020-04-14 DIAGNOSIS — O10919 Unspecified pre-existing hypertension complicating pregnancy, unspecified trimester: Secondary | ICD-10-CM

## 2020-04-14 DIAGNOSIS — Z331 Pregnant state, incidental: Secondary | ICD-10-CM

## 2020-04-14 DIAGNOSIS — Z362 Encounter for other antenatal screening follow-up: Secondary | ICD-10-CM

## 2020-04-14 DIAGNOSIS — F17200 Nicotine dependence, unspecified, uncomplicated: Secondary | ICD-10-CM

## 2020-04-14 LAB — POCT URINALYSIS DIPSTICK OB
Blood, UA: NEGATIVE
Glucose, UA: NEGATIVE
Ketones, UA: NEGATIVE
Leukocytes, UA: NEGATIVE
Nitrite, UA: NEGATIVE

## 2020-04-14 MED ORDER — DOXYLAMINE-PYRIDOXINE 10-10 MG PO TBEC: DELAYED_RELEASE_TABLET | ORAL | 6 refills | Status: DC

## 2020-04-14 MED ORDER — LABETALOL HCL 200 MG PO TABS: 200.0000 mg | ORAL_TABLET | Freq: Two times a day (BID) | ORAL | 3 refills | Status: DC

## 2020-04-14 NOTE — Progress Notes (Signed)
   NURSE VISIT- [facility]  SUBJECTIVE:  Mia Waters is a 39 y.o. [phone] female here for [facility] NIPT and [facility] Carrier Screening . She is [redacted]w[redacted]d pregnant.   OBJECTIVE:  Appears well, in no apparent distress  Blood work drawn from right [facility] without difficulty. 1 attempt(s).   ASSESSMENT: Pregnancy [redacted]w[redacted]d Pan[facility]ama NIPT and Horizon Carrier Screening  PLAN: [facility] portal information given and instructed patient how to access results   Jobe Marker  04/14/2020 2:04 PM

## 2020-04-14 NOTE — Patient Instructions (Signed)
Mia Waters, I greatly value your feedback.  If you receive a survey following your visit with [place] today, we appreciate you taking the time to fill it out.  Thanks, Joellyn Haff, CNM, WHNP-BC  [facility] ([address] [place], [place] [address]) Entrance C, located off of [address] valet parking   Begin taking 162mg  (two 81mg  tablets) baby aspirin daily to decrease the risk of preeclampsia during pregnancy    Go to [email or web address] to register for FREE online childbirth classes  [place] Pediatricians/Family Doctors:  Pediatrics [phone]            [facility] [phone]                 [facility] Medicine [phone] (usually not accepting new patients unless you have family there already, you are always welcome to call and ask)       [facility] Department [phone]       [facility] Pediatricians/Family Doctors:   [facility]: [phone]  [facility]: [phone]  [facility]: [phone]  [facility] Doctors:   [facility]: [phone]   [facility] Family Medicine: [phone]  [facility] Doctors:  [phone] Health Center: [phone]    Home Blood Pressure Monitoring for Patients   Your provider has recommended that you check your blood pressure (BP) at least once a week at home. If you do not have a blood pressure cuff at home, one will be provided for you. Contact your provider if you have not received your monitor within 1 week.   Helpful Tips for Accurate Home Blood Pressure Checks  . Don't smoke, exercise, or drink caffeine 30 minutes before checking your BP . Use the restroom before checking your BP (a full bladder can raise your pressure) . Relax in a comfortable upright chair . Feet on the ground . Left arm resting comfortably on a flat surface at the level of your heart . Legs  uncrossed . Back supported . Sit quietly and don't talk . Place the cuff on your bare arm . Adjust snuggly, so that only two fingertips can fit between your skin and the top of the cuff . Check 2 readings separated by at least one minute . Keep a log of your BP readings . For a visual, please reference this diagram: http://ccnc.care/bpdiagram  Provider Name: [facility]     Phone: [phone]  Zone 1: ALL CLEAR  Continue to monitor your symptoms:  . BP reading is less than 140 (top number) or less than 90 (bottom number)  . No right upper stomach pain . No headaches or seeing spots . No feeling nauseated or throwing up . No swelling in face and hands  Zone 2: CAUTION Call your doctor's office for any of the following:  . BP reading is greater than 140 (top number) or greater than 90 (bottom number)  . Stomach pain under your ribs in the middle or right side . Headaches or seeing spots . Feeling nauseated or throwing up . Swelling in face and hands  Zone 3: EMERGENCY  Seek immediate medical care if you have any of the following:  . BP reading is greater than160 (top number) or greater than 110 (bottom number) . Severe headaches not improving with Tylenol . Serious difficulty catching your breath . Any worsening symptoms from Zone 2     Second Trimester of Pregnancy The second trimester is  from week 14 through week 27 (months 4 through 6). The second trimester is often a time when you feel your best. Your body has adjusted to being pregnant, and you begin to feel better physically. Usually, morning sickness has lessened or quit completely, you may have more energy, and you may have an increase in appetite. The second trimester is also a time when the fetus is growing rapidly. At the end of the sixth month, the fetus is about 9 inches long and weighs about 1 pounds. You will likely begin to feel the baby move (quickening) between 16 and 20 weeks of pregnancy. Body changes  during your second trimester Your body continues to go through many changes during your second trimester. The changes vary from woman to woman.  Your weight will continue to increase. You will notice your lower abdomen bulging out.  You may begin to get stretch marks on your hips, abdomen, and breasts.  You may develop headaches that can be relieved by medicines. The medicines should be approved by your health care provider.  You may urinate more often because the fetus is pressing on your bladder.  You may develop or continue to have heartburn as a result of your pregnancy.  You may develop constipation because certain hormones are causing the muscles that push waste through your intestines to slow down.  You may develop hemorrhoids or swollen, bulging veins (varicose veins).  You may have back pain. This is caused by: ? Weight gain. ? Pregnancy hormones that are relaxing the joints in your pelvis. ? A shift in weight and the muscles that support your balance.  Your breasts will continue to grow and they will continue to become tender.  Your gums may bleed and may be sensitive to brushing and flossing.  Dark spots or blotches (chloasma, mask of pregnancy) may develop on your face. This will likely fade after the baby is born.  A dark line from your belly button to the pubic area (linea nigra) may appear. This will likely fade after the baby is born.  You may have changes in your hair. These can include thickening of your hair, rapid growth, and changes in texture. Some women also have hair loss during or after pregnancy, or hair that feels dry or thin. Your hair will most likely return to normal after your baby is born.  What to expect at prenatal visits During a routine prenatal visit:  You will be weighed to make sure you and the fetus are growing normally.  Your blood pressure will be taken.  Your abdomen will be measured to track your baby's growth.  The fetal heartbeat  will be listened to.  Any test results from the previous visit will be discussed.  Your health care provider may ask you:  How you are feeling.  If you are feeling the baby move.  If you have had any abnormal symptoms, such as leaking fluid, bleeding, severe headaches, or abdominal cramping.  If you are using any tobacco products, including cigarettes, chewing tobacco, and electronic cigarettes.  If you have any questions.  Other tests that may be performed during your second trimester include:  Blood tests that check for: ? Low iron levels (anemia). ? High blood sugar that affects pregnant women (gestational diabetes) between 49 and 28 weeks. ? Rh antibodies. This is to check for a protein on red blood cells (Rh factor).  Urine tests to check for infections, diabetes, or protein in the urine.  An ultrasound  to confirm the proper growth and development of the baby.  An amniocentesis to check for possible genetic problems.  Fetal screens for spina bifida and Down syndrome.  HIV (human immunodeficiency virus) testing. Routine prenatal testing includes screening for HIV, unless you choose not to have this test.  Follow these instructions at home: Medicines  Follow your health care provider's instructions regarding medicine use. Specific medicines may be either safe or unsafe to take during pregnancy.  Take a prenatal vitamin that contains at least 600 micrograms (mcg) of folic acid.  If you develop constipation, try taking a stool softener if your health care provider approves. Eating and drinking  Eat a balanced diet that includes fresh fruits and vegetables, whole grains, good sources of protein such as meat, eggs, or tofu, and low-fat dairy. Your health care provider will help you determine the amount of weight gain that is right for you.  Avoid raw meat and uncooked cheese. These carry germs that can cause birth defects in the baby.  If you have low calcium intake from  food, talk to your health care provider about whether you should take a daily calcium supplement.  Limit foods that are high in fat and processed sugars, such as fried and sweet foods.  To prevent constipation: ? Drink enough fluid to keep your urine clear or pale yellow. ? Eat foods that are high in fiber, such as fresh fruits and vegetables, whole grains, and beans. Activity  Exercise only as directed by your health care provider. Most women can continue their usual exercise routine during pregnancy. Try to exercise for 30 minutes at least 5 days a week. Stop exercising if you experience uterine contractions.  Avoid heavy lifting, wear low heel shoes, and practice good posture.  A sexual relationship may be continued unless your health care provider directs you otherwise. Relieving pain and discomfort  Wear a good support bra to prevent discomfort from breast tenderness.  Take warm sitz baths to soothe any pain or discomfort caused by hemorrhoids. Use hemorrhoid cream if your health care provider approves.  Rest with your legs elevated if you have leg cramps or low back pain.  If you develop varicose veins, wear support hose. Elevate your feet for 15 minutes, 3-4 times a day. Limit salt in your diet. Prenatal Care  Write down your questions. Take them to your prenatal visits.  Keep all your prenatal visits as told by your health care provider. This is important. Safety  Wear your seat belt at all times when driving.  Make a list of emergency phone numbers, including numbers for family, friends, the hospital, and police and fire departments. General instructions  Ask your health care provider for a referral to a local prenatal education class. Begin classes no later than the beginning of month 6 of your pregnancy.  Ask for help if you have counseling or nutritional needs during pregnancy. Your health care provider can offer advice or refer you to specialists for help with  various needs.  Do not use hot tubs, steam rooms, or saunas.  Do not douche or use tampons or scented sanitary pads.  Do not cross your legs for long periods of time.  Avoid cat litter boxes and soil used by cats. These carry germs that can cause birth defects in the baby and possibly loss of the fetus by miscarriage or stillbirth.  Avoid all smoking, herbs, alcohol, and unprescribed drugs. Chemicals in these products can affect the formation and growth of the  baby.  Do not use any products that contain nicotine or tobacco, such as cigarettes and e-cigarettes. If you need help quitting, ask your health care provider.  Visit your dentist if you have not gone yet during your pregnancy. Use a soft toothbrush to brush your teeth and be gentle when you floss. Contact a health care provider if:  You have dizziness.  You have mild pelvic cramps, pelvic pressure, or nagging pain in the abdominal area.  You have persistent nausea, vomiting, or diarrhea.  You have a bad smelling vaginal discharge.  You have pain when you urinate. Get help right away if:  You have a fever.  You are leaking fluid from your vagina.  You have spotting or bleeding from your vagina.  You have severe abdominal cramping or pain.  You have rapid weight gain or weight loss.  You have shortness of breath with chest pain.  You notice sudden or extreme swelling of your face, hands, ankles, feet, or legs.  You have not felt your baby move in over an hour.  You have severe headaches that do not go away when you take medicine.  You have vision changes. Summary  The second trimester is from week 14 through week 27 (months 4 through 6). It is also a time when the fetus is growing rapidly.  Your body goes through many changes during pregnancy. The changes vary from woman to woman.  Avoid all smoking, herbs, alcohol, and unprescribed drugs. These chemicals affect the formation and growth your baby.  Do not  use any tobacco products, such as cigarettes, chewing tobacco, and e-cigarettes. If you need help quitting, ask your health care provider.  Contact your health care provider if you have any questions. Keep all prenatal visits as told by your health care provider. This is important. This information is not intended to replace advice given to you by your health care provider. Make sure you discuss any questions you have with your health care provider. Document Released: 06/07/2001 Document Revised: 11/19/2015 Document Reviewed: 08/14/2012 Elsevier Interactive Patient Education  2017 [employer].

## 2020-04-14 NOTE — Progress Notes (Signed)
INITIAL OBSTETRICAL VISIT Patient name: Mia Waters MRN [id number]  Date of birth: 1981-03-30 Chief Complaint:   Initial Prenatal Visit (anatomy scan)  History of Present Illness:   Mia Waters is a 39 y.o. [id number] Caucasian female at [redacted]w[redacted]d by LMP c/w u/s at 18 weeks with an Estimated Date of Delivery: 09/15/20 being seen today for her initial obstetrical visit.   Her obstetrical history is significant for 34wk PTB d/t PPROM, term SVB x 4, SAB x 4.   Today she reports nausea- requests meds.  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 Was on Nusenta x , then hydrocodone x , now oxycodone 5mg  TID x by Dr. Smoker: 1ppd prior to pregnancy, doing nicotine lozenges, down to 5/day Depression screen [facility] 2/9 04/14/2020  Decreased Interest 0  Down, Depressed, Hopeless 0  PHQ - 2 Score 0  Altered sleeping 0  Tired, decreased energy 3  Change in appetite 0  Feeling bad or failure about yourself  0  Trouble concentrating 0  Moving slowly or fidgety/restless 0  Suicidal thoughts 0  PHQ-9 Score 3    Patient's last menstrual period was 12/10/2019 (exact date). Last pap 2021. Results were: normal Review of Systems:   Pertinent items are noted in HPI Denies cramping/contractions, leakage of fluid, vaginal bleeding, abnormal vaginal discharge w/ itching/odor/irritation, headaches, visual changes, shortness of breath, chest pain, abdominal pain, severe nausea/vomiting, or problems with urination or bowel movements unless otherwise stated above.  Pertinent History Reviewed:  Reviewed past medical,surgical, social, obstetrical and family history.  Reviewed problem list, medications and allergies. OB History  Gravida Para Term Preterm AB Living  10 5 4 1 4 5   SAB TAB Ectopic Multiple Live Births  4       5    # Outcome Date GA Lbr Len/2nd Weight  Sex Delivery Anes PTL Lv  10 Current           9 SAB 2019          8 SAB 2018          7 SAB 2018          6 Term 05/21/11 [redacted]w[redacted]d  8 lb (3.629 kg) M Vag-Spont None N LIV  5 Term 02/05/09 [redacted]w[redacted]d  8 lb (3.629 kg) F Vag-Spont Local N LIV  4 Term 09/03/07 [redacted]w[redacted]d  8 lb 6 oz (3.799 kg) F Vag-Spont None N LIV  3 SAB 2008          2 Term 08/02/05 [redacted]w[redacted]d  8 lb (3.629 kg) M Vag-Spont None N LIV  1 Preterm 12/02/99 [redacted]w[redacted]d  4 lb 10 oz (2.098 kg) F Vag-Spont EPI Y LIV     Complications: Preterm premature rupture of membranes (PPROM) delivered, current hospitalization   Physical Assessment:   Vitals:   04/14/20 1203  BP: (!) 152/90  Pulse: (!) 102  Weight: 260 lb (117.9 kg)  Body mass index is 39.53 kg/m.       Physical Examination:  General appearance - well appearing, and in no distress  Mental status - alert, oriented to person, place, and time  Psych:  She has a normal mood and affect  Skin - warm and dry, normal color, no suspicious lesions noted  Chest - effort normal, all lung fields clear to auscultation bilaterally  Heart - normal rate and regular rhythm  Abdomen - soft, nontender  Extremities:  No swelling or varicosities noted  Pelvic - VULVA: normal appearing vulva with no masses, tenderness or lesions  VAGINA: normal appearing vagina with normal color and discharge, no lesions  CERVIX: normal appearing cervix without discharge or lesions, no CMT  Thin prep pap is not done  TODAY'S ANATOMY U/S [place] 18 wks,breech,cx 3.9 cm,svp 5.7 cm,posterior placenta gr 0,normal right ovary,left ovary not visualized,EFW 255 g 86%,limited view of heart,please have pt come back for additional images,no obvious abnormalities  Results for orders placed or performed in visit on 04/14/20 (from the past 24 hour(s))  POC Urinalysis Dipstick OB   Collection Time: 04/14/20 12:23 PM  Result Value Ref Range   Color, UA     Clarity, UA     Glucose, UA Negative Negative   Bilirubin, UA     Ketones, UA neg     Spec Grav, UA     Blood, UA neg    pH, UA     POC,PROTEIN,UA Moderate (2+) Negative, Trace, Small (1+), Moderate (2+), Large (3+), 4+   Urobilinogen, UA     Nitrite, UA neg    Leukocytes, UA Negative Negative   Appearance     Odor      Assessment & Plan:  1) High-Risk Pregnancy [id number] at [redacted]w[redacted]d with an Estimated Date of Delivery: 09/15/20   2) Initial OB visit  3) Late care  4) CHTN> increase Labetalol to 200mg  BID, new rx sent, start ASA 162mg , baseline labs today  5) Chronic pain/opiate use> oxycodone 5mg  TID by Dr. , discussed NAS, weaning down opiates vs switching to Subutex- to discuss w/ [place]  6) AMA 39yo  7) Grand multipara  8) H/O 34wk PTB> d/t PPROM, was 1st pregnancy, 4 term SVBs since. Discussed Makena, gave info  9) Nausea> rx diclegis  10) Smoker> 1ppd down to 5/day, praised, to continue cessation efforts  Meds: No orders of the defined types were placed in this encounter.   Initial labs obtained Continue prenatal vitamins Reviewed n/v relief measures and warning s/s to report Reviewed recommended weight gain based on pre-gravid BMI Encouraged well-balanced diet Genetic & carrier screening discussed: requests Panorama, AFP and Horizon 14 , too late for NT/IT Ultrasound discussed; fetal survey: results reviewed CCNC completed> form faxed if has or is planning to apply for medicaid The nature of [facility] for with multiple MDs and other Advanced Practice Providers was explained to patient; also emphasized that fellows, residents, and students are part of our team.  Follow-up: Return in about 4 weeks (around 05/12/2020) for HROB, US:EFW, in person, MD or CNM.   Orders Placed This Encounter  Procedures  . Urine Culture  . GC/Chlamydia Probe Amp  . Gerilyn Pilgrim OB Follow Up  . Genetic Screening  . CBC/D/Plt+RPR+Rh+ABO+Rub Ab...  . Protein / creatinine ratio, urine  . Comprehensive metabolic panel  . AFP, Serum, Open Spina  Bifida  . POC Urinalysis Dipstick OB    [employer] CNM, [facility] 04/14/2020 12:32 PM

## 2020-04-14 NOTE — Addendum Note (Signed)
Addended by: Shawna Clamp R on: 04/14/2020 01:35 PM   Modules accepted: Level of Service

## 2020-04-14 NOTE — Progress Notes (Addendum)
[place] 18 wks,breech,cx 3.9 cm,svp 5.7 cm,posterior placenta gr 0,normal right ovary,left ovary not visualized,EFW 255 g 86%,limited view of heart,please have pt come back for additional images,no obvious abnormalities

## 2020-04-15 LAB — COMPREHENSIVE METABOLIC PANEL: Bilirubin Total: <0.2 mg/dL (ref 0.0–1.2)

## 2020-04-15 LAB — SARS-COV-2 ANTIBODIES: SARS-CoV-2 Antibodies: NEGATIVE

## 2020-04-16 LAB — CBC/D/PLT+RPR+RH+ABO+RUB AB...
Antibody Screen: NEGATIVE
Basophils Absolute: 0.1 10*3/uL (ref 0.0–0.2)
Basos: 1 %
EOS (ABSOLUTE): 0.4 10*3/uL (ref 0.0–0.4)
Eos: 2 %
HCV Ab: <0.1 s/co ratio (ref 0.0–0.9)
HIV Screen 4th Generation wRfx: NONREACTIVE
Hematocrit: 31.8 % — ABNORMAL LOW (ref 34.0–46.6)
Hemoglobin: 10.6 g/dL — ABNORMAL LOW (ref 11.1–15.9)
Hepatitis B Surface Ag: NEGATIVE
Immature Grans (Abs): 0.1 10*3/uL (ref 0.0–0.1)
Immature Granulocytes: 1 %
Lymphocytes Absolute: 4.7 10*3/uL — ABNORMAL HIGH (ref 0.7–3.1)
Lymphs: 31 %
MCH: 31.4 pg (ref 26.6–33.0)
MCHC: 33.3 g/dL (ref 31.5–35.7)
MCV: 94 fL (ref 79–97)
Monocytes Absolute: 0.7 10*3/uL (ref 0.1–0.9)
Monocytes: 5 %
Neutrophils Absolute: 9.4 10*3/uL — ABNORMAL HIGH (ref 1.4–7.0)
Neutrophils: 60 %
Platelets: 385 10*3/uL (ref 150–450)
RBC: 3.38 x10E6/uL — ABNORMAL LOW (ref 3.77–5.28)
RDW: 13 % (ref 11.7–15.4)
RPR Ser Ql: NONREACTIVE
Rh Factor: POSITIVE
Rubella Antibodies, IGG: 2.38 index (ref >0.99)
WBC: 15.4 10*3/uL — ABNORMAL HIGH (ref 3.4–10.8)

## 2020-04-16 LAB — AFP, SERUM, OPEN SPINA BIFIDA
AFP MoM: 0.78
AFP Value: 26.5 ng/mL
Gest. Age on Collection Date: 18 weeks
Maternal Age At EDD: 37.5 yr
OSBR Risk 1 IN: 10000
Test Results:: NEGATIVE
Weight: 230 [lb_av]

## 2020-04-16 LAB — COMPREHENSIVE METABOLIC PANEL
ALT: 11 IU/L (ref 0–32)
AST: 12 IU/L (ref 0–40)
Albumin/Globulin Ratio: 1.6 (ref 1.2–2.2)
Albumin: 3.9 g/dL (ref 3.8–4.8)
Alkaline Phosphatase: 48 IU/L (ref 44–121)
BUN/Creatinine Ratio: 17 (ref 9–23)
BUN: 10 mg/dL (ref 6–20)
CO2: 21 mmol/L (ref 20–29)
Calcium: 9.6 mg/dL (ref 8.7–10.2)
Chloride: 102 mmol/L (ref 96–106)
Creatinine, Ser: 0.6 mg/dL (ref 0.57–1.00)
GFR calc Af Amer: 135 mL/min/{1.73_m2} (ref >59)
GFR calc non Af Amer: 117 mL/min/{1.73_m2} (ref >59)
Globulin, Total: 2.5 g/dL (ref 1.5–4.5)
Glucose: 90 mg/dL (ref 65–99)
Potassium: 4.1 mmol/L (ref 3.5–5.2)
Sodium: 135 mmol/L (ref 134–144)
Total Protein: 6.4 g/dL (ref 6.0–8.5)

## 2020-04-16 LAB — GC/CHLAMYDIA PROBE AMP
Chlamydia trachomatis, NAA: NEGATIVE
Neisseria Gonorrhoeae by PCR: NEGATIVE

## 2020-04-16 LAB — PROTEIN / CREATININE RATIO, URINE
Creatinine, Urine: 321.8 mg/dL
Protein, Ur: 120.7 mg/dL
Protein/Creat Ratio: 375 mg/g creat — ABNORMAL HIGH (ref 0–200)

## 2020-04-16 LAB — HCV INTERPRETATION

## 2020-04-17 LAB — URINE CULTURE

## 2020-05-06 ENCOUNTER — Encounter: Payer: Self-pay | Admitting: *Deleted

## 2020-05-06 DIAGNOSIS — O0992 Supervision of high risk pregnancy, unspecified, second trimester: Secondary | ICD-10-CM

## 2020-05-12 ENCOUNTER — Other Ambulatory Visit: Payer: Medicaid Other

## 2020-05-12 ENCOUNTER — Encounter: Payer: Medicaid Other | Admitting: Women's Health

## 2020-06-27 NOTE — L&D Delivery Note (Addendum)
OB/GYN Faculty Practice Delivery Note  Mia Waters is a 40 y.o. [id number] s/p vaginal delivery at [redacted]w[redacted]d. She was admitted for IOL.   ROM: 12h 19m with clear fluid GBS Status: Unknown Maximum Maternal Temperature: 100.1  Labor Progress: Labor progressed well with augmentation to vaginal delivery   Delivery Date/Time: 1134 09/12/20 Delivery: Called to room and patient was complete and pushing. Head delivered ROA. Nuchal cord present x1,. Shoulder and body delivered in usual fashion. Infant with spontaneous cry, placed on mother's abdomen, dried and stimulated. Cord clamped x 2 after 1-minute delay, and cut by midwife given need for neonatal resuscitation. Cord blood drawn. Placenta delivered spontaneously with gentle cord traction. Fundus firm with massage and Pitocin. Labia, perineum, vagina, and cervix were inspected, no lacerations.   Placenta: Intact Complications: none  Lacerations: none  EBL: 150 mL Analgesia: Epidural  Postpartum Planning CHTN: discontinue labetalol and start procardia XL 60 mg Chronic Pain: Continue home percocet  Infant: Girl  APGARs 5 and 9  TBDg   Henrietta Hoover MD, PGY1    I was present and gloved with the resident for the entire delivery. I agree with the note above.   Thressa Sheller DNP, CNM  09/12/20  1:49 PM

## 2020-07-28 ENCOUNTER — Other Ambulatory Visit: Payer: Self-pay | Admitting: Women's Health

## 2020-08-02 ENCOUNTER — Other Ambulatory Visit: Payer: Self-pay | Admitting: Women's Health

## 2020-08-20 ENCOUNTER — Encounter: Payer: Medicaid Other | Admitting: Obstetrics and Gynecology

## 2020-08-31 ENCOUNTER — Telehealth: Payer: Medicaid Other | Admitting: Physician Assistant

## 2020-09-11 ENCOUNTER — Inpatient Hospital Stay (HOSPITAL_COMMUNITY): Payer: Medicaid Other | Admitting: Anesthesiology

## 2020-09-11 ENCOUNTER — Encounter (HOSPITAL_COMMUNITY): Payer: Self-pay | Admitting: Obstetrics and Gynecology

## 2020-09-11 ENCOUNTER — Encounter: Payer: Self-pay | Admitting: Obstetrics & Gynecology

## 2020-09-11 ENCOUNTER — Other Ambulatory Visit (HOSPITAL_COMMUNITY)
Admission: RE | Admit: 2020-09-11 | Discharge: 2020-09-11 | Disposition: A | Discharge status: Home / Self Care | Payer: Medicaid Other | Source: Ambulatory Visit | Attending: Obstetrics & Gynecology | Admitting: Obstetrics & Gynecology

## 2020-09-11 ENCOUNTER — Ambulatory Visit: Payer: Medicaid Other | Admitting: Obstetrics & Gynecology

## 2020-09-11 ENCOUNTER — Other Ambulatory Visit: Payer: Self-pay

## 2020-09-11 ENCOUNTER — Inpatient Hospital Stay (HOSPITAL_COMMUNITY)
Admission: AD | Admit: 2020-09-11 | Discharge: 2020-09-16 | DRG: 806 | Disposition: A | Discharge status: Home / Self Care | Payer: Medicaid Other | Attending: Obstetrics & Gynecology | Admitting: Obstetrics & Gynecology

## 2020-09-11 VITALS — BP 160/98 | HR 90 | Wt 267.0 lb

## 2020-09-11 DIAGNOSIS — N898 Other specified noninflammatory disorders of vagina: Secondary | ICD-10-CM

## 2020-09-11 DIAGNOSIS — O99354 Diseases of the nervous system complicating childbirth: Secondary | ICD-10-CM | POA: Diagnosis not present

## 2020-09-11 DIAGNOSIS — O99334 Smoking (tobacco) complicating childbirth: Secondary | ICD-10-CM | POA: Diagnosis present

## 2020-09-11 DIAGNOSIS — O99892 Other specified diseases and conditions complicating childbirth: Secondary | ICD-10-CM | POA: Diagnosis present

## 2020-09-11 DIAGNOSIS — O1093 Unspecified pre-existing hypertension complicating the puerperium: Secondary | ICD-10-CM | POA: Diagnosis not present

## 2020-09-11 DIAGNOSIS — O1092 Unspecified pre-existing hypertension complicating childbirth: Secondary | ICD-10-CM | POA: Diagnosis not present

## 2020-09-11 DIAGNOSIS — F17290 Nicotine dependence, other tobacco product, uncomplicated: Secondary | ICD-10-CM | POA: Diagnosis present

## 2020-09-11 DIAGNOSIS — D62 Acute posthemorrhagic anemia: Secondary | ICD-10-CM | POA: Diagnosis not present

## 2020-09-11 DIAGNOSIS — O26893 Other specified pregnancy related conditions, third trimester: Secondary | ICD-10-CM | POA: Diagnosis present

## 2020-09-11 DIAGNOSIS — O115 Pre-existing hypertension with pre-eclampsia, complicating the puerperium: Secondary | ICD-10-CM | POA: Diagnosis not present

## 2020-09-11 DIAGNOSIS — O0993 Supervision of high risk pregnancy, unspecified, third trimester: Secondary | ICD-10-CM

## 2020-09-11 DIAGNOSIS — G8929 Other chronic pain: Secondary | ICD-10-CM | POA: Diagnosis present

## 2020-09-11 DIAGNOSIS — O114 Pre-existing hypertension with pre-eclampsia, complicating childbirth: Principal | ICD-10-CM | POA: Diagnosis present

## 2020-09-11 DIAGNOSIS — Z20822 Contact with and (suspected) exposure to covid-19: Secondary | ICD-10-CM | POA: Diagnosis present

## 2020-09-11 DIAGNOSIS — O0933 Supervision of pregnancy with insufficient antenatal care, third trimester: Secondary | ICD-10-CM

## 2020-09-11 DIAGNOSIS — O10919 Unspecified pre-existing hypertension complicating pregnancy, unspecified trimester: Secondary | ICD-10-CM | POA: Diagnosis present

## 2020-09-11 DIAGNOSIS — Z3A39 39 weeks gestation of pregnancy: Secondary | ICD-10-CM | POA: Diagnosis not present

## 2020-09-11 DIAGNOSIS — K420 Umbilical hernia with obstruction, without gangrene: Secondary | ICD-10-CM | POA: Diagnosis present

## 2020-09-11 DIAGNOSIS — O99214 Obesity complicating childbirth: Secondary | ICD-10-CM | POA: Diagnosis present

## 2020-09-11 DIAGNOSIS — O9081 Anemia of the puerperium: Secondary | ICD-10-CM | POA: Diagnosis not present

## 2020-09-11 DIAGNOSIS — K219 Gastro-esophageal reflux disease without esophagitis: Secondary | ICD-10-CM | POA: Diagnosis present

## 2020-09-11 DIAGNOSIS — O1002 Pre-existing essential hypertension complicating childbirth: Secondary | ICD-10-CM | POA: Diagnosis present

## 2020-09-11 DIAGNOSIS — F17200 Nicotine dependence, unspecified, uncomplicated: Secondary | ICD-10-CM | POA: Diagnosis present

## 2020-09-11 DIAGNOSIS — O0990 Supervision of high risk pregnancy, unspecified, unspecified trimester: Secondary | ICD-10-CM

## 2020-09-11 DIAGNOSIS — O0930 Supervision of pregnancy with insufficient antenatal care, unspecified trimester: Secondary | ICD-10-CM

## 2020-09-11 HISTORY — DX: Headache, unspecified: R51.9

## 2020-09-11 HISTORY — DX: Fibromyalgia: M79.7

## 2020-09-11 HISTORY — DX: Unspecified abnormal cytological findings in specimens from vagina: R87.629

## 2020-09-11 HISTORY — DX: Tubulo-interstitial nephritis, not specified as acute or chronic: N12

## 2020-09-11 HISTORY — DX: Sciatica, left side: M54.32

## 2020-09-11 HISTORY — DX: Unspecified osteoarthritis, unspecified site: M19.90

## 2020-09-11 LAB — COMPREHENSIVE METABOLIC PANEL
ALT: 19 U/L (ref 0–44)
AST: 20 U/L (ref 15–41)
Albumin: 2.9 g/dL — ABNORMAL LOW (ref 3.5–5.0)
Alkaline Phosphatase: 71 U/L (ref 38–126)
Anion gap: 9 (ref 5–15)
BUN: 6 mg/dL (ref 6–20)
CO2: 20 mmol/L — ABNORMAL LOW (ref 22–32)
Calcium: 8.9 mg/dL (ref 8.9–10.3)
Chloride: 106 mmol/L (ref 98–111)
Creatinine, Ser: 0.62 mg/dL (ref 0.44–1.00)
GFR, Estimated: >60 mL/min (ref >60)
Glucose, Bld: 96 mg/dL (ref 70–99)
Potassium: 3.7 mmol/L (ref 3.5–5.1)
Sodium: 135 mmol/L (ref 135–145)
Total Bilirubin: 0.6 mg/dL (ref 0.3–1.2)
Total Protein: 6.2 g/dL — ABNORMAL LOW (ref 6.5–8.1)

## 2020-09-11 LAB — RAPID URINE DRUG SCREEN, HOSP PERFORMED
Amphetamines: NOT DETECTED
Barbiturates: NOT DETECTED
Benzodiazepines: NOT DETECTED
Cocaine: NOT DETECTED
Opiates: NOT DETECTED
Tetrahydrocannabinol: NOT DETECTED

## 2020-09-11 LAB — CBC
HCT: 32.8 % — ABNORMAL LOW (ref 36.0–46.0)
Hemoglobin: 10.6 g/dL — ABNORMAL LOW (ref 12.0–15.0)
MCH: 29.3 pg (ref 26.0–34.0)
MCHC: 32.3 g/dL (ref 30.0–36.0)
MCV: 90.6 fL (ref 80.0–100.0)
Platelets: 322 10*3/uL (ref 150–400)
RBC: 3.62 MIL/uL — ABNORMAL LOW (ref 3.87–5.11)
RDW: 15.3 % (ref 11.5–15.5)
WBC: 11.3 10*3/uL — ABNORMAL HIGH (ref 4.0–10.5)
nRBC: 0 % (ref 0.0–0.2)

## 2020-09-11 LAB — PROTEIN / CREATININE RATIO, URINE
Creatinine, Urine: 182.23 mg/dL
Protein Creatinine Ratio: 0.3 mg/mg{Cre} — ABNORMAL HIGH (ref 0.00–0.15)
Total Protein, Urine: 54 mg/dL

## 2020-09-11 LAB — HEPATITIS PANEL, ACUTE
HCV Ab: NONREACTIVE
Hep A IgM: NONREACTIVE
Hep B C IgM: NONREACTIVE
Hepatitis B Surface Ag: NONREACTIVE

## 2020-09-11 LAB — HEMOGLOBIN A1C
Hgb A1c MFr Bld: 5.3 % (ref 4.8–5.6)
Mean Plasma Glucose: 105.41 mg/dL

## 2020-09-11 LAB — RAPID HIV SCREEN (HIV 1/2 AB+AG)
HIV 1/2 Antibodies: NONREACTIVE
HIV-1 P24 Antigen - HIV24: NONREACTIVE

## 2020-09-11 LAB — WET PREP, GENITAL
Clue Cells Wet Prep HPF POC: NONE SEEN
Sperm: NONE SEEN
Trich, Wet Prep: NONE SEEN
Yeast Wet Prep HPF POC: NONE SEEN

## 2020-09-11 LAB — GROUP B STREP BY PCR: Group B strep by PCR: NEGATIVE

## 2020-09-11 LAB — TYPE AND SCREEN
ABO/RH(D): A POS
Antibody Screen: NEGATIVE

## 2020-09-11 LAB — NO BLOOD PRODUCTS

## 2020-09-11 LAB — SARS CORONAVIRUS 2 (TAT 6-24 HRS): SARS Coronavirus 2: NEGATIVE

## 2020-09-11 LAB — HEPATITIS B SURFACE ANTIGEN: Hepatitis B Surface Ag: NONREACTIVE

## 2020-09-11 MED ORDER — OXYTOCIN-SODIUM CHLORIDE 30-0.9 UT/500ML-% IV SOLN
2.5000 [IU]/h | INTRAVENOUS | Status: DC
  Administered 2020-09-12: 2.5 [IU]/h via INTRAVENOUS

## 2020-09-11 MED ORDER — PHENYLEPHRINE 40 MCG/ML (10ML) SYRINGE FOR IV PUSH (FOR BLOOD PRESSURE SUPPORT)
80.0000 ug | PREFILLED_SYRINGE | INTRAVENOUS | Status: DC | PRN
  Administered 2020-09-11: 80 ug via INTRAVENOUS

## 2020-09-11 MED ORDER — ACETAMINOPHEN 325 MG PO TABS
650.0000 mg | ORAL_TABLET | ORAL | Status: DC | PRN
  Administered 2020-09-11: 650 mg via ORAL
  Filled 2020-09-11: qty 2

## 2020-09-11 MED ORDER — LACTATED RINGERS IV SOLN: Freq: Once | INTRAVENOUS | Status: AC

## 2020-09-11 MED ORDER — SOD CITRATE-CITRIC ACID 500-334 MG/5ML PO SOLN
30.0000 mL | ORAL | Status: DC | PRN
  Administered 2020-09-11 – 2020-09-12 (×2): 30 mL via ORAL
  Filled 2020-09-11 (×2): qty 15

## 2020-09-11 MED ORDER — EPHEDRINE 5 MG/ML INJ: 10.0000 mg | INTRAVENOUS | Status: DC | PRN

## 2020-09-11 MED ORDER — OXYTOCIN BOLUS FROM INFUSION
333.0000 mL | Freq: Once | INTRAVENOUS | Status: AC
  Administered 2020-09-12: 333 mL via INTRAVENOUS

## 2020-09-11 MED ORDER — BUTALBITAL-APAP-CAFFEINE 50-325-40 MG PO TABS
1.0000 | ORAL_TABLET | Freq: Four times a day (QID) | ORAL | Status: DC | PRN
  Administered 2020-09-11 – 2020-09-12 (×2): 1 via ORAL
  Filled 2020-09-11 (×2): qty 1

## 2020-09-11 MED ORDER — TERBUTALINE SULFATE 1 MG/ML IJ SOLN: 0.2500 mg | Freq: Once | INTRAMUSCULAR | Status: DC | PRN

## 2020-09-11 MED ORDER — BUPRENORPHINE HCL-NALOXONE HCL 2-0.5 MG SL SUBL
1.0000 | SUBLINGUAL_TABLET | Freq: Every day | SUBLINGUAL | Status: DC
  Administered 2020-09-11: 1 via SUBLINGUAL
  Filled 2020-09-11: qty 1

## 2020-09-11 MED ORDER — LACTATED RINGERS IV SOLN: 500.0000 mL | Freq: Once | INTRAVENOUS | Status: DC

## 2020-09-11 MED ORDER — LIDOCAINE-EPINEPHRINE (PF) 2 %-1:200000 IJ SOLN
INTRAMUSCULAR | Status: DC | PRN
  Administered 2020-09-11: 4 mL via EPIDURAL

## 2020-09-11 MED ORDER — OXYTOCIN-SODIUM CHLORIDE 30-0.9 UT/500ML-% IV SOLN
1.0000 m[IU]/min | INTRAVENOUS | Status: DC
  Administered 2020-09-11: 2 m[IU]/min via INTRAVENOUS
  Filled 2020-09-11: qty 500

## 2020-09-11 MED ORDER — LABETALOL HCL 200 MG PO TABS
200.0000 mg | ORAL_TABLET | Freq: Two times a day (BID) | ORAL | Status: DC
  Administered 2020-09-11 – 2020-09-12 (×2): 200 mg via ORAL
  Filled 2020-09-11 (×2): qty 1

## 2020-09-11 MED ORDER — FENTANYL-BUPIVACAINE-NACL 0.5-0.125-0.9 MG/250ML-% EP SOLN
12.0000 mL/h | EPIDURAL | Status: DC | PRN
  Administered 2020-09-11: 12 mL/h via EPIDURAL
  Filled 2020-09-11: qty 250

## 2020-09-11 MED ORDER — OXYCODONE-ACETAMINOPHEN 5-325 MG PO TABS: 1.0000 | ORAL_TABLET | ORAL | Status: DC | PRN

## 2020-09-11 MED ORDER — PHENYLEPHRINE 40 MCG/ML (10ML) SYRINGE FOR IV PUSH (FOR BLOOD PRESSURE SUPPORT)
80.0000 ug | PREFILLED_SYRINGE | INTRAVENOUS | Status: DC | PRN
  Filled 2020-09-11: qty 10

## 2020-09-11 MED ORDER — HYDRALAZINE HCL 20 MG/ML IJ SOLN: 10.0000 mg | INTRAMUSCULAR | Status: DC | PRN

## 2020-09-11 MED ORDER — OXYCODONE-ACETAMINOPHEN 5-325 MG PO TABS: 2.0000 | ORAL_TABLET | ORAL | Status: DC | PRN

## 2020-09-11 MED ORDER — LACTATED RINGERS IV SOLN
500.0000 mL | INTRAVENOUS | Status: DC | PRN
Start: 2020-09-11 — End: 2020-09-12

## 2020-09-11 MED ORDER — LACTATED RINGERS IV SOLN: INTRAVENOUS | Status: DC

## 2020-09-11 MED ORDER — DIPHENHYDRAMINE HCL 50 MG/ML IJ SOLN
12.5000 mg | INTRAMUSCULAR | Status: DC | PRN
  Administered 2020-09-11: 12.5 mg via INTRAVENOUS
  Filled 2020-09-11: qty 1

## 2020-09-11 MED ORDER — LABETALOL HCL 5 MG/ML IV SOLN: 80.0000 mg | INTRAVENOUS | Status: DC | PRN

## 2020-09-11 MED ORDER — FENTANYL CITRATE (PF) 100 MCG/2ML IJ SOLN: 100.0000 ug | INTRAMUSCULAR | Status: DC | PRN

## 2020-09-11 MED ORDER — ONDANSETRON HCL 4 MG/2ML IJ SOLN
4.0000 mg | Freq: Four times a day (QID) | INTRAMUSCULAR | Status: DC | PRN
  Administered 2020-09-12: 4 mg via INTRAVENOUS
  Filled 2020-09-11: qty 2

## 2020-09-11 MED ORDER — LABETALOL HCL 5 MG/ML IV SOLN: 40.0000 mg | INTRAVENOUS | Status: DC | PRN

## 2020-09-11 MED ORDER — LIDOCAINE HCL (PF) 1 % IJ SOLN: 30.0000 mL | INTRAMUSCULAR | Status: DC | PRN

## 2020-09-11 MED ORDER — LABETALOL HCL 5 MG/ML IV SOLN
20.0000 mg | INTRAVENOUS | Status: DC | PRN
  Administered 2020-09-11: 20 mg via INTRAVENOUS
  Filled 2020-09-11: qty 4

## 2020-09-11 NOTE — Anesthesia Preprocedure Evaluation (Signed)
Anesthesia Evaluation  Patient identified by MRN, date of birth, ID band Patient awake    Reviewed: Allergy & Precautions, NPO status , Patient's Chart, lab work & pertinent test results  Airway Mallampati: III  TM Distance: >3 FB Neck ROM: Full    Dental no notable dental hx.    Pulmonary asthma , Current SmokerPatient did not abstain from smoking.,    Pulmonary exam normal breath sounds clear to auscultation       Cardiovascular hypertension, Pt. on medications Normal cardiovascular exam Rhythm:Regular Rate:Normal     Neuro/Psych  Headaches, negative psych ROS   GI/Hepatic Neg liver ROS, hiatal hernia, GERD  ,  Endo/Other  Morbid obesity (BMI 41)  Renal/GU negative Renal ROS  negative genitourinary   Musculoskeletal  (+) Arthritis , Fibromyalgia -, narcotic dependent  Abdominal   Peds  Hematology  (+) REFUSES BLOOD PRODUCTS, JEHOVAH'S WITNESS  Anesthesia Other Findings On subutex for sciatic pain  Reproductive/Obstetrics (+) Pregnancy                             Anesthesia Physical Anesthesia Plan  ASA: III  Anesthesia Plan: Epidural   Post-op Pain Management:    Induction:   PONV Risk Score and Plan: Treatment may vary due to age or medical condition  Airway Management Planned: Natural Airway  Additional Equipment:   Intra-op Plan:   Post-operative Plan:   Informed Consent: I have reviewed the patients History and Physical, chart, labs and discussed the procedure including the risks, benefits and alternatives for the proposed anesthesia with the patient or authorized representative who has indicated his/her understanding and acceptance.       Plan Discussed with: Anesthesiologist  Anesthesia Plan Comments: (Patient identified. Risks, benefits, options discussed with patient including but not limited to bleeding, infection, nerve damage, paralysis, failed block,  incomplete pain control, headache, blood pressure changes, nausea, vomiting, reactions to medication, itching, and post partum back pain. Confirmed with bedside nurse the patient's most recent platelet count. Confirmed with the patient that they are not taking any anticoagulation, have any bleeding history or any family history of bleeding disorders. Patient expressed understanding and wishes to proceed. All questions were answered. )        Anesthesia Quick Evaluation

## 2020-09-11 NOTE — Anesthesia Procedure Notes (Signed)
Epidural Patient location during procedure: OB Start time: 09/11/2020 8:40 PM End time: 09/11/2020 8:50 PM  Staffing Anesthesiologist: Elmer Picker, MD Performed: anesthesiologist   Preanesthetic Checklist Completed: patient identified, IV checked, risks and benefits discussed, monitors and equipment checked, pre-op evaluation and timeout performed  Epidural Patient position: sitting Prep: DuraPrep and site prepped and draped Patient monitoring: continuous pulse ox, blood pressure, heart rate and cardiac monitor Approach: midline Location: L3-L4 Injection technique: LOR air  Needle:  Needle type: Tuohy  Needle gauge: 17 G Needle length: 9 cm Needle insertion depth: 7 cm Catheter type: closed end flexible Catheter size: 19 Gauge Catheter at skin depth: 12 cm Test dose: negative  Assessment Sensory level: T8 Events: blood not aspirated, injection not painful, no injection resistance, no paresthesia and negative IV test  Additional Notes Patient identified. Risks/Benefits/Options discussed with patient including but not limited to bleeding, infection, nerve damage, paralysis, failed block, incomplete pain control, headache, blood pressure changes, nausea, vomiting, reactions to medication both or allergic, itching and postpartum back pain. Confirmed with bedside nurse the patient's most recent platelet count. Confirmed with patient that they are not currently taking any anticoagulation, have any bleeding history or any family history of bleeding disorders. Patient expressed understanding and wished to proceed. All questions were answered. Sterile technique was used throughout the entire procedure. Please see nursing notes for vital signs. Test dose was given through epidural catheter and negative prior to continuing to dose epidural or start infusion. Warning signs of high block given to the patient including shortness of breath, tingling/numbness in hands, complete motor block,  or any concerning symptoms with instructions to call for help. Patient was given instructions on fall risk and not to get out of bed. All questions and concerns addressed with instructions to call with any issues or inadequate analgesia.  Reason for block:procedure for pain

## 2020-09-11 NOTE — H&P (Signed)
OBSTETRIC ADMISSION HISTORY AND PHYSICAL  Mia Waters is a 40 y.o. female [id number] at [redacted]w[redacted]d by L/18 presenting for IOL secondary to cHTN (labetalol 200mg  BID) in the setting of scant prenatal care. She reports +FMs, No LOF, no VB, no blurry vision, headaches or peripheral edema, and RUQ pain.  She plans on bottle feeding. She requests POPs until Paraguard IUD can be placed at [facility] visit for birth control.   She received her prenatal care at [facility].  Dating: By L/18 ---> Estimated Date of Delivery: 09/15/20  Sono: @[redacted]w[redacted]d , CWD, normal anatomy, breech presentation, posterior lie, 255g, 86% EFW  Prenatal History/Complications:  - Late Prenatal Care - AMA - cHTN (labetalol 200mg  BID in pregnancy) - occasional E-cigarette use - Anxiety  Past Medical History: Past Medical History:  Diagnosis Date  . Arthritis    Coccyx   . Asthma    "grew out of it'  . Fibromyalgia   . Gastritis   . GERD (gastroesophageal reflux disease)   . H/O hiatal hernia   . Headache   . History of migraine   . Hypertension   . Pyelonephritis   . Sciatic pain, left   . Vaginal Pap smear, abnormal    f/u ok    Past Surgical History: Past Surgical History:  Procedure Laterality Date  . DILATION AND CURETTAGE OF UTERUS    . MOUTH SURGERY    . UMBILICAL HERNIA REPAIR N/A 04/03/2014   Procedure: HERNIA REPAIR UMBILICAL ;  Surgeon: , MD;  Location: [facility];  Service: General;  Laterality: N/A;    Obstetrical History: OB History     Gravida  10   Para  5   Term  4   Preterm  1   AB  4   Living  5      SAB  4   IAB      Ectopic      Multiple      Live Births  5           Social History Social History   Socioeconomic History  . Marital status: Single    Spouse name: Not on file  . Number of children: Not on file  . Years of education: Not on file  . Highest education level: Not on file  Occupational History  . Not on file  Tobacco Use  . Smoking  status: Current Some Day Smoker    Packs/day: 0.50    Years: 20.00    Pack years: 10.00    Types: Cigarettes, E-cigarettes    Last attempt to quit: 05/18/2015    Years since quitting: 5.3  . Smokeless tobacco: Never Used  . Tobacco comment: reports quit cigarettes, occ e-cigarettes  Vaping Use  . Vaping Use: Never used  Substance and Sexual Activity  . Alcohol use: Not Currently    Alcohol/week: 1.0 standard drink    Types: 1 Glasses of wine per week    Comment: 2-4 times per month  . Drug use: No    Comment: prescription opiates  . Sexual activity: Not Currently    Birth control/protection: None  Other Topics Concern  . Not on file  Social History Narrative  . Not on file   Social Determinants of Health   Financial Resource Strain: Low Risk   . Difficulty of Paying Living Expenses: Not hard at all  Food Insecurity: No Food Insecurity  . Worried About 06/03/2014 in the Last Year: Never true  .  Ran Out of Food in the Last Year: Never true  Transportation Needs: No Transportation Needs  . Lack of Transportation (Medical): No  . Lack of Transportation (Non-Medical): No  Physical Activity: Inactive  . Days of Exercise per Week: 0 days  . Minutes of Exercise per Session: 0 min  Stress: No Stress Concern Present  . Feeling of Stress : Not at all  Social Connections: Socially Isolated  . Frequency of Communication with Friends and Family: More than three times a week  . Frequency of Social Gatherings with Friends and Family: Once a week  . Attends Religious Services: Never  . Active Member of Clubs or Organizations: No  . Attends Banker Meetings: Never  . Marital Status: Never married    Family History: Family History  Problem Relation Age of Onset  . Diabetes Mother   . Hypertension Mother   . Heart disease Mother        heart failure  . Stroke Mother        "mini strokes"  . Heart disease Father        congenital  . Healthy Brother   .  Asthma Daughter   . Healthy Daughter   . Healthy Son   . Healthy Brother   . Healthy Son   . Healthy Daughter   . Healthy Daughter   . Cancer Other   . Heart disease Other        grandfather  . Alzheimer's disease Other        grandmother  . Colon cancer Neg Hx     Allergies: No Known Allergies  Medications Prior to Admission  Medication Sig Dispense Refill Last Dose  . aspirin EC 81 MG tablet Take 81 mg by mouth 2 (two) times daily. Swallow whole.   09/11/2020 at 1000  . buprenorphine (SUBUTEX) 2 MG SUBL SL tablet buprenorphine HCl 2 mg sublingual tablet  Place 1 tablet every day by sublingual route for 5 days.   09/10/2020 at 1300  . Doxylamine-Pyridoxine (DICLEGIS) 10-10 MG TBEC 2 tabs q hs, if sx persist add 1 tab q am on day 3, if sx persist add 1 tab q afternoon on day 4 100 tablet 6 09/11/2020 at 1000  . labetalol (NORMODYNE) 200 MG tablet Take 1 tablet by mouth twice daily 60 tablet 0 09/11/2020 at 1000  . Prenatal Vit-Fe Fumarate-FA (PRENATAL VITAMIN PO) Take by mouth.   09/10/2020 at Unknown time     Review of Systems   All systems reviewed and negative except as stated in HPI  Blood pressure (!) 152/89, pulse 87, temperature 98.8 F (37.1 C), temperature source Oral, resp. rate 18, height 5\' 8"  (1.727 m), weight 121.1 kg, last menstrual period 12/10/2019, SpO2 98 %, unknown if currently breastfeeding. General appearance: alert, cooperative and no distress Lungs: normal respiratory effort Heart: regular rate  Abdomen: soft, non-tender Extremities:  no sign of DVT Presentation: cephalic Fetal monitoring: Baseline: 145 bpm, Variability: Good {> 6 bpm), Accelerations: Reactive and Decelerations: Absent Uterine activity: irregular Dilation: 1 Effacement (%): 50 Station: -2 Exam by:: Dr. [email or web address]   Prenatal labs: ABO, Rh: --/--/A POS (03/18 1410) Antibody: NEG (03/18 1410) Rubella: 2.38 (10/19 1532) RPR: Non Reactive (10/19 1532)  HBsAg: NON REACTIVE, NON REACTIVE  (03/18 1410)  HIV: NON REACTIVE (03/18 1410)  GBS: NEGATIVE/-- (03/18 1621)  1 hr Glucola: 90 Genetic screening: Negative [employer] 03-30-1972: wnl  Prenatal Transfer Tool  Maternal Diabetes: No Genetic Screening: Normal Maternal Ultrasounds/Referrals:  Normal  Fetal Ultrasounds or other Referrals:  None Maternal Substance Abuse:  Yes:  Type: Other:  infrequent use of e-cigarrette Significant Maternal Medications:  Meds include: Other: Labetalol, suboxone (prescribed) Significant Maternal Lab Results: Group B Strep negative  Results for orders placed or performed during the hospital encounter of 09/11/20 (from the past 24 hour(s))  Protein / creatinine ratio, urine   Collection Time: 09/11/20  1:10 PM  Result Value Ref Range   Creatinine, Urine 182.23 mg/dL   Total Protein, Urine 54 mg/dL   Protein Creatinine Ratio 0.30 (H) 0.00 - 0.15 mg/mg[Cre]  Rapid urine drug screen (hospital performed)   Collection Time: 09/11/20  1:10 PM  Result Value Ref Range   Opiates NONE DETECTED NONE DETECTED   Cocaine NONE DETECTED NONE DETECTED   Benzodiazepines NONE DETECTED NONE DETECTED   Amphetamines NONE DETECTED NONE DETECTED   Tetrahydrocannabinol NONE DETECTED NONE DETECTED   Barbiturates NONE DETECTED NONE DETECTED  CBC   Collection Time: 09/11/20  2:10 PM  Result Value Ref Range   WBC 11.3 (H) 4.0 - 10.5 K/uL   RBC 3.62 (L) 3.87 - 5.11 MIL/uL   Hemoglobin 10.6 (L) 12.0 - 15.0 g/dL   HCT 78.9 (L) 38.1 - 01.7 %   MCV 90.6 80.0 - 100.0 fL   MCH 29.3 26.0 - 34.0 pg   MCHC 32.3 30.0 - 36.0 g/dL   RDW 51.0 25.8 - 52.7 %   Platelets 322 150 - 400 K/uL   nRBC 0.0 0.0 - 0.2 %  Comprehensive metabolic panel   Collection Time: 09/11/20  2:10 PM  Result Value Ref Range   Sodium 135 135 - 145 mmol/L   Potassium 3.7 3.5 - 5.1 mmol/L   Chloride 106 98 - 111 mmol/L   CO2 20 (L) 22 - 32 mmol/L   Glucose, Bld 96 70 - 99 mg/dL   BUN 6 6 - 20 mg/dL   Creatinine, Ser 7.82 0.44 - 1.00 mg/dL    Calcium 8.9 8.9 - 42.3 mg/dL   Total Protein 6.2 (L) 6.5 - 8.1 g/dL   Albumin 2.9 (L) 3.5 - 5.0 g/dL   AST 20 15 - 41 U/L   ALT 19 0 - 44 U/L   Alkaline Phosphatase 71 38 - 126 U/L   Total Bilirubin 0.6 0.3 - 1.2 mg/dL   GFR, Estimated >53 >61 mL/min   Anion gap 9 5 - 15  Hepatitis B surface antigen   Collection Time: 09/11/20  2:10 PM  Result Value Ref Range   Hepatitis B Surface Ag NON REACTIVE NON REACTIVE  Hepatitis panel, acute   Collection Time: 09/11/20  2:10 PM  Result Value Ref Range   Hepatitis B Surface Ag NON REACTIVE NON REACTIVE   HCV Ab NON REACTIVE NON REACTIVE   Hep A IgM NON REACTIVE NON REACTIVE   Hep B C IgM NON REACTIVE NON REACTIVE  Rapid HIV screen (HIV 1/2 Ab+Ag)   Collection Time: 09/11/20  2:10 PM  Result Value Ref Range   HIV-1 P24 Antigen - HIV24 NON REACTIVE NON REACTIVE   HIV 1/2 Antibodies NON REACTIVE NON REACTIVE   Interpretation (HIV Ag Ab)      A non reactive test result means that HIV 1 or HIV 2 antibodies and HIV 1 p24 antigen were not detected in the specimen.  Hemoglobin A1c   Collection Time: 09/11/20  2:10 PM  Result Value Ref Range   Hgb A1c MFr Bld 5.3 4.8 - 5.6 %  Mean Plasma Glucose 105.41 mg/dL  Type and screen [facility]   Collection Time: 09/11/20  2:10 PM  Result Value Ref Range   ABO/RH(D) A POS    Antibody Screen NEG    Sample Expiration      09/14/2020,2359 Performed at [facility], [address] [place], [place] [address]   Wet prep, genital   Collection Time: 09/11/20  2:21 PM   Specimen: PATH Cytology Cervicovaginal Ancillary Only  Result Value Ref Range   Yeast Wet Prep HPF POC NONE SEEN NONE SEEN   Trich, Wet Prep NONE SEEN NONE SEEN   Clue Cells Wet Prep HPF POC NONE SEEN NONE SEEN   WBC, Wet Prep HPF POC MANY (A) NONE SEEN   Sperm NONE SEEN   Group B strep by PCR   Collection Time: 09/11/20  4:21 PM   Specimen: Vaginal/Rectal; Genital  Result Value Ref Range   Group B strep by  PCR NEGATIVE NEGATIVE    Patient Active Problem List   Diagnosis Date Noted  . Supervision of high risk pregnancy, antepartum 09/11/2020  . Supervision of high-risk pregnancy 04/14/2020  . Smoker 04/14/2020  . Late prenatal care 04/14/2020  . Chronic pain 11/11/2019  . Chronic hypertension affecting pregnancy 11/11/2019  . GERD (gastroesophageal reflux disease) 07/02/2015  . LLQ abdominal pain 01/05/2015  . Umbilical hernia, incarcerated 04/03/2014    Assessment/Plan:  Derry Skillmy D Persinger is a 40 y.o. [id number] at 4052w3d here for IOL secondary to cHTN on labetalol 200mg  BID.   #Labor: FB placed on admission. Low dose pitocin started shortly after arrival; will plan to up-titrate s/p expulsion of FB. #cHTN: Continue labetalol 200mg  BID on admission. Asymptomatic. F/u Preeclampsia labs on admission. #Chronic Pain: Suboxone in pregnancy. Continue on arrival. #E-Cigarette Use: pt declines nicotine patch #Pain: TBD per pt preference #FWB: Cat 1 strip #ID: GBS negative per PCR on admission #MOF: Breast #MOC: POPs until Paraguard placement @ PP visit.   Renae Glosseve Jansen, Student-PA  09/11/2020, 7:21 PM   Attestation of Supervision of Student:  I confirm that I have verified the information documented in the physician assistant student's note and that I have also personally reperformed the history, physical exam and all medical decision making activities.  I have verified that all services and findings are accurately documented in this student's note; and I agree with management and plan as outlined in the documentation. I have also made any necessary editorial changes.  Sheila OatsAnna E Jaclyn Carew, MD Center for [facility], [facility] 09/11/2020 7:59 PM

## 2020-09-11 NOTE — MAU Note (Signed)
Pt arrived by EMS (transportation only). Pt for direct admit.  Induction for chronic hypertension.

## 2020-09-11 NOTE — Progress Notes (Signed)
   HIGH-RISK PREGNANCY VISIT Patient name: Mia Waters MRN [id number]  Date of birth: 12-13-1980 Chief Complaint:   Fluid leaking  History of Present Illness:   Mia Waters is a 40 y.o. [id number] female at [redacted]w[redacted]d with an Estimated Date of Delivery: 09/15/20 being seen today for concerns of rupture.  She feels like she is leaking fluid since last night.  No contractions, no VB, +fetal movement though less than previous.  -scant PNC- last seen 03/2020 -cHTN- On Labetalol 200mg  bid and ASA -Opioid use- did not discuss current medication    Depression screen [facility] 2/9 04/14/2020  Decreased Interest 0  Down, Depressed, Hopeless 0  PHQ - 2 Score 0  Altered sleeping 0  Tired, decreased energy 3  Change in appetite 0  Feeling bad or failure about yourself  0  Trouble concentrating 0  Moving slowly or fidgety/restless 0  Suicidal thoughts 0  PHQ-9 Score 3     Current Outpatient Medications  Medication Instructions  . buprenorphine (SUBUTEX) 2 MG SUBL SL tablet buprenorphine HCl 2 mg sublingual tablet  Place 1 tablet every day by sublingual route for 5 days.  . Doxylamine-Pyridoxine (DICLEGIS) 10-10 MG TBEC 2 tabs q hs, if sx persist add 1 tab q am on day 3, if sx persist add 1 tab q afternoon on day 4  . labetalol (NORMODYNE) 200 MG tablet Take 1 tablet by mouth twice daily  . Prenatal Vit-Fe Fumarate-FA (PRENATAL VITAMIN PO) Oral     Review of Systems:   Pertinent items are noted in HPI Denies abnormal vaginal discharge w/ itching/odor/irritation, headaches, visual changes, shortness of breath, chest pain, abdominal pain, severe nausea/vomiting, or problems with urination or bowel movements unless otherwise stated above. Pertinent History Reviewed:  Reviewed past medical,surgical, social, obstetrical and family history.  Reviewed problem list, medications and allergies. Physical Assessment:   Vitals:   09/11/20 1157 09/11/20 1158  BP: (!) 166/95 (!) 160/98  Pulse: 86 90  Weight:  267 lb (121.1 kg)   Body mass index is 40.6 kg/m.           Physical Examination:   General appearance: alert, well appearing, and in no distress  Mental status: alert, oriented to person, place, and time  Skin: warm & dry   Extremities:      Cardiovascular: normal heart rate noted  Respiratory: normal respiratory effort, no distress  Abdomen: gravid, soft, non-tender  GU: neg pooling, neg ferning, cervix appears closed  Pelvic: Cervical exam performed  Dilation: Closed      Fetal Status: Fetal Heart Rate (bpm): 140   Movement: Present    Fetal Surveillance Testing today: none   Chaperone: Angel Neas    No results found for this or any previous visit (from the past 24 hour(s)).   Assessment & Plan:  High-risk pregnancy: 09/13/20 at [redacted]w[redacted]d with an Estimated Date of Delivery: 09/15/20   1) cHTN- on Labetalol 300mg  bid -pt sent directly to L&D for IOL due to concern for uncontrolled HTN vs superimposed preeclampsia  Ruled out for ruptured  Meds: No orders of the defined types were placed in this encounter.   Labs/procedures today: GC/C,  Treatment Plan:  Sent to L&D   Follow-up: Return for admit to L&D.   No future appointments.  No orders of the defined types were placed in this encounter.   09/17/20, DO Attending Obstetrician & Gynecologist, [facility] for Mia Waters, [facility]

## 2020-09-11 NOTE — Progress Notes (Signed)
Labor Progress Note Mia Waters is a 40 y.o. [id number] at [redacted]w[redacted]d presented for IOL for cHTN S: Feeling some slight pressure   O:  BP 131/88   Pulse 84   Temp 98.8 F (37.1 C) (Oral)   Resp 18   Ht 5\' 8"  (1.727 m)   Wt 121.1 kg   LMP 12/10/2019 (Exact Date)   SpO2 97%   BMI 40.59 kg/m  EFM: baseline 130/mod variability/no accels/late decels improved s/p phenylephrine   CVE: Dilation: 7.5 Effacement (%): 80 Station: -2 Presentation: Vertex Exam by:: Dr. [email or web address]   A&P: 40 y.o. 30 [redacted]w[redacted]d here for IOL for cHTN.   #Labor: Progressing well. Continue Pitocin. SROM at 2300. Anticiapte SVD   #cHTN w SIPEC: UPC .30. on labetalol 200mg  BID. Had few severe range pressures that normalized on recheck, now hypotensive to normotensive since epidural placement. Will continue to monitor closely.   #Chronic Pain: PMP reviewed. Had previously been prescribed QID percocet, last prescribed 2/9. Reports buprenorphine 2mg  being prescribed as ?adjunt medicine. UDS neg. Unclear why this was done. 4/9 Reports plans to be back on QID percocet after delivery. Discussed extensively at bedside with patient. Agreed to restart percocet at BID prn starting after delivery but emphasized that we would not prescribe this at discharge. Patient amenable to plan.   #Declines blood products. Starting hgb 10.6.   #Pain: epidural in place  #FWB: cat II due to late decels, overall improved after phenylephrine  #GBS negative   Marland Kitchen, MD 11:27 PM

## 2020-09-12 ENCOUNTER — Encounter (HOSPITAL_COMMUNITY): Payer: Self-pay | Admitting: Obstetrics and Gynecology

## 2020-09-12 DIAGNOSIS — O99354 Diseases of the nervous system complicating childbirth: Secondary | ICD-10-CM

## 2020-09-12 DIAGNOSIS — Z3A39 39 weeks gestation of pregnancy: Secondary | ICD-10-CM

## 2020-09-12 DIAGNOSIS — G8929 Other chronic pain: Secondary | ICD-10-CM

## 2020-09-12 DIAGNOSIS — O1092 Unspecified pre-existing hypertension complicating childbirth: Secondary | ICD-10-CM

## 2020-09-12 LAB — RPR: RPR Ser Ql: NONREACTIVE

## 2020-09-12 MED ORDER — SIMETHICONE 80 MG PO CHEW: 80.0000 mg | CHEWABLE_TABLET | ORAL | Status: DC | PRN

## 2020-09-12 MED ORDER — INFLUENZA VAC SPLIT QUAD 0.5 ML IM SUSY: 0.5000 mL | PREFILLED_SYRINGE | INTRAMUSCULAR | Status: DC

## 2020-09-12 MED ORDER — PRENATAL MULTIVITAMIN CH
1.0000 | ORAL_TABLET | Freq: Every day | ORAL | Status: DC
Start: 2020-09-13 — End: 2020-09-17
  Administered 2020-09-13 – 2020-09-16 (×4): 1 via ORAL
  Filled 2020-09-12 (×4): qty 1

## 2020-09-12 MED ORDER — NIFEDIPINE ER OSMOTIC RELEASE 30 MG PO TB24
60.0000 mg | ORAL_TABLET | Freq: Every day | ORAL | Status: DC
  Administered 2020-09-12 – 2020-09-13 (×2): 60 mg via ORAL
  Filled 2020-09-12 (×2): qty 2

## 2020-09-12 MED ORDER — MAGNESIUM SULFATE BOLUS VIA INFUSION
4.0000 g | Freq: Once | INTRAVENOUS | Status: AC
  Administered 2020-09-12: 4 g via INTRAVENOUS
  Filled 2020-09-12: qty 1000

## 2020-09-12 MED ORDER — MAGNESIUM SULFATE 40 GM/1000ML IV SOLN
2.0000 g/h | INTRAVENOUS | Status: AC
  Administered 2020-09-13: 2 g/h via INTRAVENOUS
  Filled 2020-09-12 (×2): qty 1000

## 2020-09-12 MED ORDER — FAMOTIDINE 20 MG PO TABS
20.0000 mg | ORAL_TABLET | Freq: Two times a day (BID) | ORAL | Status: DC
  Administered 2020-09-12 – 2020-09-16 (×8): 20 mg via ORAL
  Filled 2020-09-12 (×8): qty 1

## 2020-09-12 MED ORDER — DIPHENHYDRAMINE HCL 50 MG/ML IJ SOLN
12.5000 mg | Freq: Once | INTRAMUSCULAR | Status: AC
  Administered 2020-09-12: 12.5 mg via INTRAVENOUS
  Filled 2020-09-12: qty 1

## 2020-09-12 MED ORDER — COCONUT OIL OIL: 1.0000 "application " | TOPICAL_OIL | Status: DC | PRN

## 2020-09-12 MED ORDER — DIPHENHYDRAMINE HCL 25 MG PO CAPS: 25.0000 mg | ORAL_CAPSULE | Freq: Four times a day (QID) | ORAL | Status: DC | PRN

## 2020-09-12 MED ORDER — BENZOCAINE-MENTHOL 20-0.5 % EX AERO: 1.0000 "application " | INHALATION_SPRAY | CUTANEOUS | Status: DC | PRN

## 2020-09-12 MED ORDER — IBUPROFEN 600 MG PO TABS
600.0000 mg | ORAL_TABLET | Freq: Four times a day (QID) | ORAL | Status: DC
  Administered 2020-09-12 – 2020-09-16 (×12): 600 mg via ORAL
  Filled 2020-09-12 (×14): qty 1

## 2020-09-12 MED ORDER — GABAPENTIN 300 MG PO CAPS
300.0000 mg | ORAL_CAPSULE | Freq: Two times a day (BID) | ORAL | Status: DC
Start: 2020-09-12 — End: 2020-09-17
  Administered 2020-09-12 – 2020-09-16 (×8): 300 mg via ORAL
  Filled 2020-09-12 (×8): qty 1

## 2020-09-12 MED ORDER — ACETAMINOPHEN 325 MG PO TABS
650.0000 mg | ORAL_TABLET | ORAL | Status: DC | PRN
  Administered 2020-09-13: 650 mg via ORAL
  Filled 2020-09-12: qty 2

## 2020-09-12 MED ORDER — ZOLPIDEM TARTRATE 5 MG PO TABS: 5.0000 mg | ORAL_TABLET | Freq: Every evening | ORAL | Status: DC | PRN

## 2020-09-12 MED ORDER — OXYCODONE HCL 5 MG PO TABS
5.0000 mg | ORAL_TABLET | Freq: Once | ORAL | Status: AC
  Administered 2020-09-12: 5 mg via ORAL
  Filled 2020-09-12: qty 1

## 2020-09-12 MED ORDER — ONDANSETRON HCL 4 MG/2ML IJ SOLN: 4.0000 mg | INTRAMUSCULAR | Status: DC | PRN

## 2020-09-12 MED ORDER — ONDANSETRON HCL 4 MG PO TABS
4.0000 mg | ORAL_TABLET | ORAL | Status: DC | PRN
  Administered 2020-09-12: 4 mg via ORAL
  Filled 2020-09-12 (×2): qty 1

## 2020-09-12 MED ORDER — OXYCODONE-ACETAMINOPHEN 7.5-325 MG PO TABS
1.0000 | ORAL_TABLET | Freq: Two times a day (BID) | ORAL | Status: DC
  Administered 2020-09-12 (×2): 1 via ORAL
  Filled 2020-09-12 (×2): qty 1

## 2020-09-12 MED ORDER — DIBUCAINE (PERIANAL) 1 % EX OINT: 1.0000 "application " | TOPICAL_OINTMENT | CUTANEOUS | Status: DC | PRN

## 2020-09-12 MED ORDER — TETANUS-DIPHTH-ACELL PERTUSSIS 5-2.5-18.5 LF-MCG/0.5 IM SUSY: 0.5000 mL | PREFILLED_SYRINGE | Freq: Once | INTRAMUSCULAR | Status: DC

## 2020-09-12 MED ORDER — LACTATED RINGERS IV SOLN: INTRAVENOUS | Status: DC

## 2020-09-12 MED ORDER — GABAPENTIN 600 MG PO TABS
300.0000 mg | ORAL_TABLET | Freq: Two times a day (BID) | ORAL | Status: DC
  Filled 2020-09-12: qty 0.5

## 2020-09-12 MED ORDER — MEDROXYPROGESTERONE ACETATE 150 MG/ML IM SUSP: 150.0000 mg | INTRAMUSCULAR | Status: DC | PRN

## 2020-09-12 MED ORDER — SENNOSIDES-DOCUSATE SODIUM 8.6-50 MG PO TABS
2.0000 | ORAL_TABLET | ORAL | Status: DC
  Administered 2020-09-12 – 2020-09-16 (×4): 2 via ORAL
  Filled 2020-09-12 (×4): qty 2

## 2020-09-12 MED ORDER — GABAPENTIN 300 MG PO CAPS
300.0000 mg | ORAL_CAPSULE | Freq: Two times a day (BID) | ORAL | Status: DC
  Filled 2020-09-12: qty 1

## 2020-09-12 MED ORDER — WITCH HAZEL-GLYCERIN EX PADS: 1.0000 "application " | MEDICATED_PAD | CUTANEOUS | Status: DC | PRN

## 2020-09-12 NOTE — Lactation Note (Signed)
This note was copied from a baby's chart. Lactation Consultation Note  Patient Name: Mia Waters [id number] Date: 09/12/2020 Reason for consult: Initial assessment;Term Age:38 hours   Mom wanted to latch baby on the right side through modified cradle position. Baby would latch at the breast but wouldn't suck. Attempted latching baby on the left side and baby showed no interest. Mom shared had mastitis on the left breast during pregnancy and was prescribed antibiotics because she had been lactating. Returned to the right breast to attempt latching again; baby would latch but still no audible swallowing or sucking.   LC Student placed baby STS and started hand expressing on the right breast. Colostrum was present and drops were collected and spoon fed to baby; LC Student was able to hand express 1 mL and spoon fed to baby. Baby was still sleeping soundly at mom's chest.   LC Student proceeded to set up hand pump and DEBP; educated mom through using the hand pump and expressed drops that were fed directly back to baby. Discussed the usage of the DEBP and to pump for stimulation and supplementation for baby. Also discussed hydration, nutrition, and rest during recovery at this time.  Feeding Plan STS as much as possible  Feed baby on demand with EBM  Pumping/Breast stimulation every 2-3 hrs    Maternal Data Has patient been taught Hand Expression?: Yes Does the patient have breastfeeding experience prior to this delivery?: Yes How long did the patient breastfeed?: 3 children for 2-3 months  Feeding Mother's Current Feeding Choice: Breast Milk  LATCH Score Latch: Repeated attempts needed to sustain latch, nipple held in mouth throughout feeding, stimulation needed to elicit sucking reflex.  Audible Swallowing: None  Type of Nipple: Everted at rest and after stimulation  Comfort (Breast/Nipple): Soft / non-tender  Hold (Positioning): Assistance needed to correctly position  infant at breast and maintain latch.  LATCH Score: 6   Lactation Tools Discussed/Used Tools: Pump;Flanges Flange Size: 24 Breast pump type: Double-Electric Breast Pump;Manual Pump Education: Setup, frequency, and cleaning;Milk Storage Reason for Pumping: stimulation and supplementation Pumping frequency: with feedings  Interventions Interventions: Breast feeding basics reviewed;Assisted with latch;Skin to skin;Breast massage;Hand express;Breast compression;Support pillows;Expressed milk;Hand pump;DEBP;Education  Discharge Pump: Personal WIC Program: No (plan's to apply to [place])  Consult Status Consult Status: Follow-up Date: 09/13/20 Follow-up type: In-patient    Angelica Terrilee Croak [facility] Student 09/12/2020, 4:51 PM  I concur with Lactation Note written by [facility] student.  [name] A Higuera Ancidey 09/12/2020, 5:02 PM

## 2020-09-12 NOTE — Plan of Care (Signed)
  Problem: Education: Goal: Knowledge of General Education information will improve Description: Including pain rating scale, medication(s)/side effects and non-pharmacologic comfort measures Outcome: Progressing   Problem: Health Behavior/Discharge Planning: Goal: Ability to manage health-related needs will improve Outcome: Progressing   Problem: Clinical Measurements: Goal: Ability to maintain clinical measurements within normal limits will improve Outcome: Progressing Goal: Will remain free from infection Outcome: Progressing Goal: Diagnostic test results will improve Outcome: Progressing   Problem: Activity: Goal: Risk for activity intolerance will decrease Outcome: Progressing   Problem: Nutrition: Goal: Adequate nutrition will be maintained Outcome: Progressing   Problem: Coping: Goal: Level of anxiety will decrease Outcome: Progressing   Problem: Pain Managment: Goal: General experience of comfort will improve Outcome: Progressing   Problem: Education: Goal: Knowledge of condition will improve Outcome: Progressing   Problem: Education: Goal: Knowledge of disease or condition will improve Outcome: Progressing Goal: Knowledge of the prescribed therapeutic regimen will improve Outcome: Progressing   Problem: Clinical Measurements: Goal: Complications related to disease process, condition or treatment will be avoided or minimized Outcome: Progressing

## 2020-09-12 NOTE — Discharge Summary (Signed)
Postpartum Discharge Summary      Patient Name: Mia Waters DOB: Jul 30, 1980 MRN: [id number]  Date of admission: 09/11/2020 Delivery date:09/12/2020  Delivering provider: Verneda Skill  Date of discharge: 09/15/2020  Admitting diagnosis: Supervision of high risk pregnancy, antepartum [O09.90] Intrauterine pregnancy: [redacted]w[redacted]d    Secondary diagnosis:  Active Problems:   Umbilical hernia, incarcerated   GERD (gastroesophageal reflux disease)   Chronic pain   Chronic hypertension affecting pregnancy   Smoker   Late prenatal care   Supervision of high risk pregnancy, antepartum   Anemia of mother in pregnancy, postpartum condition  Additional problems: cHTN with SIPE with severe features (headache)    Discharge diagnosis: Term Pregnancy Delivered, Preeclampsia (severe) and CHTN with superimposed preeclampsia                                              Augmentation: Pitocin Complications: None  Hospital course: Induction of Labor With Vaginal Delivery   40y.o. yo [id number] admitted to the hospital 09/11/2020 for induction of labor.  Indication for induction: cHTN.  Patient had an uncomplicated labor course as follows: Membrane Rupture Time/Date: 10:44 PM ,09/11/2020   Delivery Method:Vaginal, Spontaneous  Episiotomy: None  Lacerations:  None  Details of delivery can be found in separate delivery note.  Patient had a routine postpartum course and was discharged form on procardia 60 mg BID. Patient is discharged home 09/15/20.  Newborn Data: Birth date:09/12/2020  Birth time:11:34 AM  Gender:Female  Living status:Living  Apgars:5 ,9  Weight:3881 g   Magnesium Sulfate received: Yes: Seizure prophylaxis BMZ received: No Rhophylac:N/A MMR:N/A Transfusion:No  Physical exam  Vitals:   09/14/20 2359 09/15/20 0000 09/15/20 0508 09/15/20 0716  BP: 138/83  (!) 147/87 (!) 152/84  Pulse: 77  (!) 102 86  Resp: _0 Temp: 98 F (36.7 C)  98 F (36.7 C) 97.6 F  (36.4 C)  TempSrc: Oral  Oral Oral  SpO2: 98% 97% 98% 99%  Weight:      Height:       General: alert, cooperative and no distress Lochia: appropriate Uterine Fundus: firm DVT Evaluation: No evidence of DVT seen on physical exam. No cords or calf tenderness. Labs: Lab Results  Component Value Date   WBC 22.4 (H) 09/13/2020   HGB 9.2 (L) 09/13/2020   HCT 27.8 (L) 09/13/2020   MCV 89.1 09/13/2020   PLT 307 09/13/2020   CMP Latest Ref Rng & Units 09/13/2020  Glucose 70 - 99 mg/dL 108(H)  BUN 6 - 20 mg/dL 5(L)  Creatinine 0.44 - 1.00 mg/dL 0.68  Sodium 135 - 145 mmol/L 135  Potassium 3.5 - 5.1 mmol/L 3.7  Chloride 98 - 111 mmol/L 105  CO2 22 - 32 mmol/L 23  Calcium 8.9 - 10.3 mg/dL 7.7(L)  Total Protein 6.5 - 8.1 g/dL 5.4(L)  Total Bilirubin 0.3 - 1.2 mg/dL 0.3  Alkaline Phos 38 - 126 U/L 57  AST 15 - 41 U/L 18  ALT 0 - 44 U/L 14   Edinburgh Score: Edinburgh Postnatal Depression Scale Screening Tool 09/12/2020  I have been able to laugh and see the funny side of things. 0  I have looked forward with enjoyment to things. 1  I have blamed myself unnecessarily when things went wrong. 0  I have been anxious or  worried for no good reason. 0  I have felt scared or panicky for no good reason. 1  Things have been getting on top of me. 1  I have been so unhappy that I have had difficulty sleeping. 0  I have felt sad or miserable. 0  I have been so unhappy that I have been crying. 0  The thought of harming myself has occurred to me. 0  Edinburgh Postnatal Depression Scale Total 3     After visit meds:  Allergies as of 09/15/2020   No Known Allergies     Medication List    STOP taking these medications   aspirin EC 81 MG tablet   Doxylamine-Pyridoxine 10-10 MG Tbec Commonly known as: Diclegis   labetalol 200 MG tablet Commonly known as: NORMODYNE     TAKE these medications   buprenorphine 2 MG Subl SL tablet Commonly known as: SUBUTEX buprenorphine HCl 2 mg  sublingual tablet  Place 1 tablet every day by sublingual route for 5 days.   ferrous sulfate 325 (65 FE) MG tablet Take 1 tablet (325 mg total) by mouth every other day.   ibuprofen 600 MG tablet Commonly known as: ADVIL Take 1 tablet (600 mg total) by mouth every 6 (six) hours.   NIFEdipine 60 MG 24 hr tablet Commonly known as: ADALAT CC Take 1 tablet (60 mg total) by mouth 2 (two) times daily.   PRENATAL VITAMIN PO Take by mouth.        Discharge home in stable condition Infant Feeding: Breast Infant Disposition:rooming in Discharge instruction: per After Visit Summary and Postpartum booklet. Activity: Advance as tolerated. Pelvic rest for 6 weeks.  Diet: low salt diet Future Appointments: Future Appointments  Date Time Provider [place]  09/18/2020 10:10 AM [facility] NURSE [facility]  10/20/2020 11:50 AM Roma Schanz, CNM [facility]   Follow up Visit:  Follow-up Information    [facility] Follow up on 09/18/2020.   Why: For incision and blood pressure check Contact information: [place] [id number] [phone]               Please schedule this patient for a In person postpartum visit in 4 weeks with the following provider: Any provider. Additional Postpartum F/U:BP check 1 week  High risk pregnancy complicated by: HTN Delivery mode:  Vaginal, Spontaneous  Anticipated Birth Control:  Unsure PP message sent 09/12/20  09/15/2020 Mora Bellman, MD

## 2020-09-12 NOTE — Progress Notes (Signed)
Labor Progress Note Mia Waters is a 40 y.o. [id number] at [redacted]w[redacted]d presented for IOL for cHTN S: Feeling some rectal pressure now   O:  BP 137/75   Pulse 81   Temp 98.8 F (37.1 C) (Oral)   Resp 16   Ht 5\' 8"  (1.727 m)   Wt 121.1 kg   LMP 12/10/2019 (Exact Date)   SpO2 97%   BMI 40.59 kg/m  EFM: baseline 130/mod variability/no accels/rare late decels  CVE: Dilation: 8 Effacement (%): 60 Station: -2 Presentation: Vertex Exam by:: Dr. [email or web address]   A&P: 40 y.o. 30 [redacted]w[redacted]d here for IOL for cHTN.   #Labor: Progressing well. Continue Pitocin. SROM at 2300. Limited change since then, though do think that baby recently changed from LOT to LOA. IUPC Placed. Pit@8 . Anticiapte SVD    #cHTN w SIPEC: UPC .30. on labetalol 200mg  BID. Had few severe range pressures that normalized on recheck, now mild range   #Chronic Pain: PMP reviewed. Had previously been prescribed QID percocet, last prescribed 2/9. Reports buprenorphine 2mg  being prescribed as ?adjunt medicine. UDS neg. Unclear why this was done. 4/9 Reports plans to be back on QID percocet after delivery. Discussed extensively at bedside with patient. Agreed to restart percocet at BID prn starting after delivery but emphasized that we would not prescribe this at discharge. Patient amenable to plan.   #Declines blood products. Starting hgb 10.6.   #Pain: epidural in place  #FWB: cat II due to late decels  #GBS negative   Marland Kitchen, MD 4:50 AM

## 2020-09-12 NOTE — Lactation Note (Incomplete)
This note was copied from a baby's chart. Lactation Consultation Note  Patient Name: Mia Waters [id number] Date: 09/12/2020 Reason for consult: Initial assessment;Term Age:38 hours   Mom wanted to latch baby on the right side through modified cradle position. Baby would latch at the breast but wouldn't suck. Attempted latching baby on the left side and baby showed no interest. Mom shared had mastitis on the left breast during pregnancy and was prescribed antibiotics because she had been lactating. Returned to the right breast to attempt latching again; baby would latch but still no audible swallowing or sucking.   LC Student placed baby STS and started hand expressing on the right breast. Colostrum was present and drops were collected and spoon fed to baby; LC Student was able to hand express 1 mL and spoon fed to baby. Baby was still sleeping soundly at mom's chest.   LC Student proceeded to set up hand pump and DEBP; educated mom through using the hand pump and expressed drops that were fed directly back to baby. Discussed the usage of the DEBP and to pump for stimulation and supplementation for baby. Also discussed hydration, nutrition, and rest during recovery at this time.  Feeding Plan STS as much as possible  Feed baby on demand with EBM  Pumping/Breast stimulation every 2-3 hrs    Maternal Data Has patient been taught Hand Expression?: Yes Does the patient have breastfeeding experience prior to this delivery?: Yes How long did the patient breastfeed?: 3 children for 2-3 months  Feeding Mother's Current Feeding Choice: Breast Milk  LATCH Score Latch: Repeated attempts needed to sustain latch, nipple held in mouth throughout feeding, stimulation needed to elicit sucking reflex.  Audible Swallowing: None  Type of Nipple: Everted at rest and after stimulation  Comfort (Breast/Nipple): Soft / non-tender  Hold (Positioning): Assistance needed to correctly position infant at  breast and maintain latch.  LATCH Score: 6   Lactation Tools Discussed/Used Tools: Pump;Flanges Flange Size: 24 Breast pump type: Double-Electric Breast Pump;Manual Pump Education: Setup, frequency, and cleaning;Milk Storage Reason for Pumping: stimulation and supplementation Pumping frequency: with feedings  Interventions Interventions: Breast feeding basics reviewed;Assisted with latch;Skin to skin;Breast massage;Hand express;Breast compression;Support pillows;Expressed milk;Hand pump;DEBP;Education  Discharge Pump: Personal WIC Program: No (plan's to apply to [place])  Consult Status Consult Status: Follow-up Date: 09/13/20 Follow-up type: In-patient    Angelica Knight 09/12/2020, 4:51 PM

## 2020-09-13 DIAGNOSIS — O115 Pre-existing hypertension with pre-eclampsia, complicating the puerperium: Secondary | ICD-10-CM

## 2020-09-13 DIAGNOSIS — O1093 Unspecified pre-existing hypertension complicating the puerperium: Secondary | ICD-10-CM

## 2020-09-13 DIAGNOSIS — O9081 Anemia of the puerperium: Secondary | ICD-10-CM | POA: Diagnosis not present

## 2020-09-13 LAB — CBC
HCT: 27.8 % — ABNORMAL LOW (ref 36.0–46.0)
Hemoglobin: 9.2 g/dL — ABNORMAL LOW (ref 12.0–15.0)
MCH: 29.5 pg (ref 26.0–34.0)
MCHC: 33.1 g/dL (ref 30.0–36.0)
MCV: 89.1 fL (ref 80.0–100.0)
Platelets: 307 10*3/uL (ref 150–400)
RBC: 3.12 MIL/uL — ABNORMAL LOW (ref 3.87–5.11)
RDW: 15.6 % — ABNORMAL HIGH (ref 11.5–15.5)
WBC: 22.4 10*3/uL — ABNORMAL HIGH (ref 4.0–10.5)
nRBC: 0 % (ref 0.0–0.2)

## 2020-09-13 LAB — COMPREHENSIVE METABOLIC PANEL
ALT: 14 U/L (ref 0–44)
AST: 18 U/L (ref 15–41)
Albumin: 2.3 g/dL — ABNORMAL LOW (ref 3.5–5.0)
Alkaline Phosphatase: 57 U/L (ref 38–126)
Anion gap: 7 (ref 5–15)
BUN: 5 mg/dL — ABNORMAL LOW (ref 6–20)
CO2: 23 mmol/L (ref 22–32)
Calcium: 7.7 mg/dL — ABNORMAL LOW (ref 8.9–10.3)
Chloride: 105 mmol/L (ref 98–111)
Creatinine, Ser: 0.68 mg/dL (ref 0.44–1.00)
GFR, Estimated: >60 mL/min (ref >60)
Glucose, Bld: 108 mg/dL — ABNORMAL HIGH (ref 70–99)
Potassium: 3.7 mmol/L (ref 3.5–5.1)
Sodium: 135 mmol/L (ref 135–145)
Total Bilirubin: 0.3 mg/dL (ref 0.3–1.2)
Total Protein: 5.4 g/dL — ABNORMAL LOW (ref 6.5–8.1)

## 2020-09-13 LAB — MAGNESIUM: Magnesium: 4.9 mg/dL — ABNORMAL HIGH (ref 1.7–2.4)

## 2020-09-13 MED ORDER — FERROUS SULFATE 325 (65 FE) MG PO TABS
325.0000 mg | ORAL_TABLET | ORAL | Status: DC
  Administered 2020-09-13 – 2020-09-15 (×2): 325 mg via ORAL
  Filled 2020-09-13 (×2): qty 1

## 2020-09-13 MED ORDER — LABETALOL HCL 5 MG/ML IV SOLN: 40.0000 mg | INTRAVENOUS | Status: DC | PRN

## 2020-09-13 MED ORDER — NIFEDIPINE 10 MG PO CAPS: 20.0000 mg | ORAL_CAPSULE | ORAL | Status: DC | PRN

## 2020-09-13 MED ORDER — NIFEDIPINE ER OSMOTIC RELEASE 30 MG PO TB24
60.0000 mg | ORAL_TABLET | Freq: Two times a day (BID) | ORAL | Status: DC
  Administered 2020-09-13 – 2020-09-16 (×6): 60 mg via ORAL
  Filled 2020-09-13 (×6): qty 2

## 2020-09-13 MED ORDER — ACETAMINOPHEN 325 MG PO TABS
650.0000 mg | ORAL_TABLET | Freq: Four times a day (QID) | ORAL | Status: DC | PRN
  Administered 2020-09-13: 650 mg via ORAL
  Filled 2020-09-13 (×2): qty 2

## 2020-09-13 MED ORDER — OXYCODONE-ACETAMINOPHEN 7.5-325 MG PO TABS
1.0000 | ORAL_TABLET | Freq: Four times a day (QID) | ORAL | Status: DC | PRN
  Administered 2020-09-13 – 2020-09-16 (×14): 1 via ORAL
  Filled 2020-09-13 (×14): qty 1

## 2020-09-13 MED ORDER — NIFEDIPINE 10 MG PO CAPS
10.0000 mg | ORAL_CAPSULE | ORAL | Status: DC | PRN
  Administered 2020-09-13: 10 mg via ORAL
  Filled 2020-09-13: qty 1

## 2020-09-13 NOTE — Anesthesia Postprocedure Evaluation (Signed)
Anesthesia Post Note  Patient: Mia Waters  Procedure(s) Performed: AN AD HOC LABOR EPIDURAL     Patient location during evaluation: Mother Baby Anesthesia Type: Epidural Level of consciousness: awake and alert and oriented Pain management: satisfactory to patient Vital Signs Assessment: post-procedure vital signs reviewed and stable Respiratory status: respiratory function stable Cardiovascular status: stable Postop Assessment: no headache, no backache, epidural receding, patient able to bend at knees, no signs of nausea or vomiting, adequate PO intake and able to ambulate Anesthetic complications: no   No complications documented.  Last Vitals:  Vitals:   09/13/20 0746 09/13/20 0800  BP: 137/69   Pulse: 92   Resp: 18 18  Temp: 36.6 C   SpO2: 96%     Last Pain:  Vitals:   09/13/20 0746  TempSrc: Axillary  PainSc:    Pain Goal: Patients Stated Pain Goal: 1 (09/13/20 0126)                 Karleen Dolphin

## 2020-09-13 NOTE — Progress Notes (Signed)
Post Partum Day 1 s/p VD at [redacted]w[redacted]d in the setting of CHTN with superimposed severe preeclampsia  Subjective: Reports mild headache, awaiting pain medication.  No other complaints, up ad lib, voiding, tolerating PO and + flatus.  Baby stable at bedside.  Patient denies any visual symptoms, RUQ/epigastric pain or other concerning symptoms.  Objective: Blood pressure 137/69, pulse 92, temperature 97.8 F (36.6 C), temperature source Axillary, resp. rate 18, height 5\' 8"  (1.727 m), weight 121.1 kg, last menstrual period 12/10/2019, SpO2 96 %, unknown if currently breastfeeding.  Physical Exam:  General: alert Lochia: appropriate Uterine Fundus: firm DVT Evaluation: No evidence of DVT seen on physical exam. Negative Homan's sign. No cords or calf tenderness. No significant calf/ankle edema.  Labs:  CBC Latest Ref Rng & Units 09/13/2020 09/11/2020 04/14/2020  WBC 4.0 - 10.5 K/uL 22.4(H) 11.3(H) 15.4(H)  Hemoglobin 12.0 - 15.0 g/dL 04/16/2020) 10.6(L) 10.6(L)  Hematocrit 36.0 - 46.0 % 27.8(L) 32.8(L) 31.8(L)  Platelets 150 - 400 K/uL 307 322 385   CMP Latest Ref Rng & Units 09/13/2020 09/11/2020 04/14/2020  Glucose 70 - 99 mg/dL 04/16/2020) 96 90  BUN 6 - 20 mg/dL 5(L) 6 10  Creatinine 947(M - 1.00 mg/dL 5.46 5.03 5.46  Sodium 135 - 145 mmol/L 135 135 135  Potassium 3.5 - 5.1 mmol/L 3.7 3.7 4.1  Chloride 98 - 111 mmol/L 105 106 102  CO2 22 - 32 mmol/L 23 20(L) 21  Calcium 8.9 - 10.3 mg/dL 7.7(L) 8.9 9.6  Total Protein 6.5 - 8.1 g/dL 5.68) 6.2(L) 6.4  Total Bilirubin 0.3 - 1.2 mg/dL 0.3 0.6 1.2(X  Alkaline Phos 38 - 126 U/L 57 71 48  AST 15 - 41 U/L 18 20 12   ALT 0 - 44 U/L 14 19 11     Assessment/Plan: Continue magnesium sulfate for eclampsia prophylaxis until 1300 today Continue Procardia XL daily, good BP control Oral iron started for postpartum anemia due to acute blood loss Breastfeeding, POPs for contraception No other acute concerns Possible discharge tomorrow morning   LOS: 2 days    <5.1, MD 09/13/2020, 9:26 AM

## 2020-09-13 NOTE — Lactation Note (Signed)
This note was copied from a baby's chart. Lactation Consultation Note  Patient Name: Mia Waters [id number] Date: 09/13/2020 Reason for consult: Follow-up assessment (mom on Mag) Age:38 hours   Mom is on Mag.  She is concerned about her milk supply due to not seeing colostrum and previous mastitis in left breast three weeks ago.  She states infant fed for 25 minutes about an hour ago.    Hand expression reviewed.  Mom is tender on right nipple due to feeding per mom.  Mom isn't putting infant to left side because she is afraid she doesn't have milk.  LC discussed using the DEBP instead of the hand pump to provide stimulation to milk supply.  DEBP reviewed and settings explained to mom.  Mom was open to trying to pump.  Mom used a 27 on left side and 24 size flange on right.    Mom requested to pump now while infant was asleep.  LC helped mom begin pumping.  Flange fit looked correct and was comfortable for mom.    LC encouraged mom to feed baby on demand, do STS, hand express prior to latching,  and to try to pump after BF throughout the day today.    Mom also knows to hand express after pumping to collect any EBM to feed to infant.    Mom was encouraged to call out for staff to observe a feeding today.      Maternal Data Has patient been taught Hand Expression?: Yes  Feeding Mother's Current Feeding Choice: Breast Milk  LATCH Score                    Lactation Tools Discussed/Used Tools: Pump Flange Size: 24;27 Breast pump type: Double-Electric Breast Pump Pump Education: Setup, frequency, and cleaning Reason for Pumping: stimlulation to milk supply Pumping frequency: Mom used the DEBP for the first time during LC visit today  Interventions Interventions: Breast feeding basics reviewed;Hand express;DEBP  Discharge Pump: DEBP  Consult Status Consult Status: Follow-up Date: 09/14/20 Follow-up type: In-patient    Maryruth Hancock [facility] 09/13/2020, 9:19  AM

## 2020-09-13 NOTE — Clinical Social Work Maternal (Signed)
CLINICAL SOCIAL WORK MATERNAL/CHILD NOTE  Patient Details  Name: Mia Waters MRN: [id number] Date of Birth: December 25, 1980  Date:  09/13/2020  Clinical Social Worker Initiating Note:  Darra Lis, MSW, [place] Date/Time: Initiated:  09/13/20/0120     Child's Name:  Durwin Reges   Biological Parents:  Mother   Need for Interpreter:  None   Reason for Referral:  Current Substance Use/Substance Use During Pregnancy ,Late or No Prenatal Care    Address:  [address] [place] [address]    Phone number:  [phone] (home)     Additional phone number:   Household Members/Support Persons (HM/SP):   Household Member/Support Person 1,Household Member/Support Person 2,Household Member/Support Person 3,Household Member/Support Person 4   HM/SP Name Relationship DOB or Age  HM/SP -21 Launa Grill 08/02/2005  HM/SP -2 Melody English Daughter 09/03/2007  HM/SP -3 Mallory English Daughter 02/05/2009  HM/SP -4 Jeneen Rinks Thereasa Parkin 05/21/2011  HM/SP -5        HM/SP -6        HM/SP -7        HM/SP -8          Natural Supports (not living in the home):  Immediate Family,Friends   Professional Supports: Other (Comment) (Dr. Merlene Laughter for medication management)   Employment: Self-employed   Type of Work:     Education:  Nurse, adult   Homebound arranged:    Museum/gallery curator Resources:  [employer]   Other Resources:  Theatre stage manager Considerations Which May Impact Care: Jehovah Witness per chart  Strengths:  Psychotropic Medications,Ability to meet basic needs ,Pediatrician chosen,Home prepared for child    Psychotropic Medications:  Subutex      Pediatrician:     ([place])  Pediatrician List:   [place]      Pediatrician Fax Number:    Risk Factors/Current Problems:  None   Cognitive State:  Goal Oriented ,Alert  ,Insightful ,Linear Thinking    Mood/Affect:  Happy ,Calm ,Bright ,Interested    CSW Assessment: CSW consulted for [facility] and Subutex use. CSW met with MOB to assess and offer support. CSW introduced self and role. CSW observed MOB holding infant. MOB was open, pleasant and engaged throughout assessment. CSW informed MOB of reason for consult. MOB was understanding. MOB reported she received limited prenatal care due to being "really nauseous and sick" during the pregnancy. MOB stated she had a difficult pregnancy which she attributes to her blood pressure and age. MOB denies any additional barriers to prenatal care.  CSW discussed MOB Subutex use. MOB reported she was prescribed Subutex 86m about a week ago, which she takes once a day. MOB stated prior to the Subutex prescription, she was taking Percocet 7.59m every 6 hours. MOB reported she stopped taking the Percocet 6 days prior to giving birth. MOB shared she plans to stop taking the Subutex once her Percocet is refilled. MOB has been taking pain medications for years to manage pain. CSW informed MOB of the hospital drug screen policy. MOB informed infant's UDS is positive for Barbiturates, which was provided during admission. MOB informed CDS results are pending. MOB made aware that a CPS notification will be made, which is policy for substance exposed infants. MOB informed a CPS report will be made if infant test positive for any substances not prescribed.  MOB was understanding and denied any illegal substance use. MOB did not acknowledge any CPS history.   MOB reported she lives with her four younger children. 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 MOB stated she plans to apply for [facility] in [place]. MOB denies any mental health history. MOB reported she is currently feeling okay and was observed smiling at infant. MOB identified a number of family members and friends as supports. MOB denies any current SI, HI or being  involved in DV.   CSW provided education regarding the baby blues period versus PPD. CSW provided the New Mom Checklist and encouraged MOB to self evaluate and contact a medical professional if symptoms are noted at any time.  CSW provided review of Sudden Infant Death Syndrome (SIDS) precautions. MOB reported she has all essentials for infant, including a bassinet. MOB has identified a pediatrician and denies any barriers to care. MOB denies having any additional needs at this time.  CSW will make a CPS notification. CSW will continue to follow CDS and make a CPS report if warranted. CSW identifies no further need for intervention and no barriers to discharge at this time.  CSW Plan/Description:  No Further Intervention Required/No Barriers to [place] Will Continue to Monitor Umbilical Cord Tissue Drug Screen Results and Make Report if Warranted,Child Protective Service Report ,Hospital Drug Screen Policy Information,Perinatal Mood and Anxiety Disorder (PMADs) Education,Other Patient/Family Education,Other Information/Referral to [employer] Resources,Sudden Infant Death Syndrome (SIDS) Education    Waylan Boga, [place] 09/13/2020, 1:43 PM

## 2020-09-14 DIAGNOSIS — O115 Pre-existing hypertension with pre-eclampsia, complicating the puerperium: Secondary | ICD-10-CM | POA: Diagnosis not present

## 2020-09-14 DIAGNOSIS — O1093 Unspecified pre-existing hypertension complicating the puerperium: Secondary | ICD-10-CM | POA: Diagnosis not present

## 2020-09-14 LAB — CERVICOVAGINAL ANCILLARY ONLY
Bacterial Vaginitis (gardnerella): NEGATIVE
Candida Glabrata: NEGATIVE
Candida Vaginitis: NEGATIVE
Chlamydia: NEGATIVE
Comment: NEGATIVE
Comment: NEGATIVE
Comment: NEGATIVE
Comment: NEGATIVE
Comment: NEGATIVE
Comment: NORMAL
Neisseria Gonorrhea: NEGATIVE
Trichomonas: NEGATIVE

## 2020-09-14 LAB — GC/CHLAMYDIA PROBE AMP (~~LOC~~) NOT AT ARMC
Chlamydia: NEGATIVE
Comment: NEGATIVE
Comment: NORMAL
Neisseria Gonorrhea: NEGATIVE

## 2020-09-14 MED ORDER — BUTALBITAL-APAP-CAFFEINE 50-325-40 MG PO TABS
2.0000 | ORAL_TABLET | Freq: Four times a day (QID) | ORAL | Status: DC | PRN
  Administered 2020-09-14 – 2020-09-15 (×4): 2 via ORAL
  Filled 2020-09-14 (×4): qty 2

## 2020-09-14 MED ORDER — CYCLOBENZAPRINE HCL 10 MG PO TABS
5.0000 mg | ORAL_TABLET | Freq: Three times a day (TID) | ORAL | Status: DC | PRN
  Administered 2020-09-14 – 2020-09-16 (×4): 5 mg via ORAL
  Filled 2020-09-14 (×4): qty 1

## 2020-09-14 NOTE — Lactation Note (Signed)
This note was copied from a baby's chart. Lactation Consultation Note  Patient Name: Mia Waters [id number] Date: 09/14/2020 Reason for consult: Follow-up assessment Age:38 hours   P6, Baby sleeping in crib.  Mother unwrapped baby to latch.  Baby recently breastfed for 10 min but mother was willing to put baby to breast.  Mother has nipple soreness and is using coconut oil and ebm.  Baby latched with intermittent swallows. Reviewed engorgement care and monitoring voids/stools.   Maternal Data Mother was on MgSo4.  HTN.    Feeding  Observed  LATCH Score Latch: Grasps breast easily, tongue down, lips flanged, rhythmical sucking.  Audible Swallowing: A few with stimulation  Type of Nipple: Everted at rest and after stimulation  Comfort (Breast/Nipple): Filling, red/small blisters or bruises, mild/mod discomfort  Hold (Positioning): No assistance needed to correctly position infant at breast.  LATCH Score: 8    Interventions Interventions: Breast feeding basics reviewed;Skin to skin;Hand express;Support pillows;DEBP;Education  Discharge Discharge Education: Engorgement and breast care  Mother has personal DEBP at home and plans to continue to post pump.  Consult Status Consult Status: Complete Date: 09/14/20    Dahlia Byes [facility] 09/14/2020, 9:09 AM

## 2020-09-14 NOTE — Lactation Note (Addendum)
This note was copied from a baby's chart. Lactation Consultation Note  Patient Name: Mia Waters [id number] Date: 09/14/2020   Age:38 hours, ETI infant with -2% weight loss. Per mom, infant had 6 stools and 7 voids since birth. Infant is now latch on both breast and most feedings are 25 minutes in length. Mom has no breastfeeding concerns for LC at this time, she feels infant is latching well. LC did not see latch at this time, mom finished breastfeeding infant 30 minutes prior to [facility] entering the room. Mom will continue to breastfeed infant according to cues 8 to 12 or more times within 24 hours, infant is currently cluster  feeding.  Per mom, when using DEBP she is now starting to get 4 or 5 mls of EBM that she is spoon feeding to infant. Mom knows to call RN or LC if she has any questions, concerns or need assistance with latching infant at the breast.   Maternal Data    Feeding    LATCH Score                    Lactation Tools Discussed/Used    Interventions    Discharge    Consult Status      Danelle Earthly 09/14/2020, 1:07 AM

## 2020-09-14 NOTE — Progress Notes (Signed)
Post Partum Day 2 Subjective: Patient reports feeling well with a mild headache. She is otherwise doing well. She is ambulating, voiding and tolerating a regular diet. Patient denies visual changes, RUQ/epigastric pain, nausea/emesis.  Objective: Blood pressure (!) 145/80, pulse 97, temperature 98.1 F (36.7 C), temperature source Oral, resp. rate 18, height 5\' 8"  (1.727 m), weight 121.1 kg, last menstrual period 12/10/2019, SpO2 95 %, unknown if currently breastfeeding.  Physical Exam:  General: alert, cooperative and no distress Lochia: appropriate Uterine Fundus: firm DVT Evaluation: No evidence of DVT seen on physical exam.  Recent Labs    09/11/20 1410 09/13/20 0525  HGB 10.6* 9.2*  HCT 32.8* 27.8*    Assessment/Plan: Patient completed magnesium sulfate for seizure prophylaxis Procardia increased overnight to 60mg  BID with improvement in BP Continue close monitoring BP Discharge planning tomorrow if BP remain stable   LOS: 3 days   Mia Waters 09/14/2020, 10:07 AM

## 2020-09-15 ENCOUNTER — Other Ambulatory Visit: Payer: Self-pay | Admitting: Obstetrics and Gynecology

## 2020-09-15 DIAGNOSIS — O1093 Unspecified pre-existing hypertension complicating the puerperium: Secondary | ICD-10-CM | POA: Diagnosis not present

## 2020-09-15 DIAGNOSIS — O115 Pre-existing hypertension with pre-eclampsia, complicating the puerperium: Secondary | ICD-10-CM | POA: Diagnosis not present

## 2020-09-15 MED ORDER — FERROUS SULFATE 325 (65 FE) MG PO TABS: 325.0000 mg | ORAL_TABLET | ORAL | 3 refills | Status: DC

## 2020-09-15 MED ORDER — NIFEDIPINE ER 60 MG PO TB24: 60.0000 mg | ORAL_TABLET | Freq: Two times a day (BID) | ORAL | 30 refills | Status: DC

## 2020-09-15 MED ORDER — IBUPROFEN 600 MG PO TABS: 600.0000 mg | ORAL_TABLET | Freq: Four times a day (QID) | ORAL | 0 refills | Status: DC

## 2020-09-15 MED FILL — IBUPROFEN 600 MG TABLET: 600 | 7 days supply | Qty: 30 | Fill #0

## 2020-09-15 MED FILL — NIFEdipine ER 60 MG TB24: 60 | 30 days supply | Qty: 60 | Fill #0

## 2020-09-15 MED FILL — FERROUS SULFATE 325 MG TAB: 325 (65 FE) | 60 days supply | Qty: 30 | Fill #0

## 2020-09-15 NOTE — Progress Notes (Signed)
Discharge instructions and prescriptions given to pt. Discussed post vag delivery care, signs and symptoms to report to the MD, upcoming appointments, and meds. Pt verbalizes understanding and has no questions at this time.

## 2020-09-15 NOTE — Lactation Note (Signed)
This note was copied from a baby's chart. Lactation Consultation Note  Patient Name: Mia Waters [id number] Date: 09/15/2020 Reason for consult: Follow-up assessment;Mother's request;Term;Infant weight loss;Hyperbilirubinemia;Other (Comment) (Percocet for pain control) Age:  38 hrs  Mom on a series of medications Fiorcet, Gabapentin, Nifedipine, Labetalol, Percocet, Zolpidem. [facility] contacted Pediatric Pharmacist, Cindee Salt, to reviews medication and safety with breastfeeding. Currently, Mom can safely breastfeed given medications listed above according to pharmacist.   [facility] alerted by RN, Salena Saner, Mom will also be switched to Subutex on discharge. LC also discussed plan with provider, Dr. Tawanna Solo.   Mom had infant in cradle position after 10 minutes infant sleeping on the breast. Little sucking with compression or stimulation. LC set up the SNS with 5 french offering 3 ml of  EBM first. Infant became more alert, eyes open and transferring more from the breast, 5 french and syringe. LC then started 24 kcal formula offering 6 ml but infant still takes her time total of 17 minutes of feeding. LC left the room for about 5 minutes, on return Mom stated she gave last 6 ml of formula. Mom crying throughout the visit complaining about the use of formula. I continued to ask her permission to offer formula which she responded yes but still referenced it was not her preference.   I received a second call from pharmacist, Cindee Salt, that Mom was not on a maintenance dose of Subutex and he will need to confer with her OBGYN on this medication. I called Dr. Tawanna Solo and mentioned the findings listed in this note.   Plan 1. Mom to offer both breasts, STS and look for milk transfer with breast compression.           2. Mom to offer EBM/ Formula 24 kcal/oz via SNS with 5 french tube based on breastfeeding supplementation guidelines and hrs of age after birth.            3.  Mom to pump using DEBP q 3hrs for 15 minutes.   Feeding plans may change after providers' review controlled substances and its impact on breastfeeding. LC also alerted RN, Salena Saner on all the findings above.     Maternal Data    Feeding Mother's Current Feeding Choice: Breast Milk and Formula  LATCH Score Latch: Repeated attempts needed to sustain latch, nipple held in mouth throughout feeding, stimulation needed to elicit sucking reflex.  Audible Swallowing: A few with stimulation (Once started using SNS, infant more alert increasing her transfer from the breast and 5 french)  Type of Nipple: Everted at rest and after stimulation  Comfort (Breast/Nipple): Soft / non-tender  Hold (Positioning): Assistance needed to correctly position infant at breast and maintain latch.  LATCH Score: 7   Lactation Tools Discussed/Used Tools: 41F feeding tube / Syringe;Other (comment) (12 cc syringe) Breast pump type: Double-Electric Breast Pump Pump Education: Setup, frequency, and cleaning Reason for Pumping: for stimulation Pumping frequency: every 3 hrs for 15 minutes  Interventions Interventions: Assisted with latch;Breast compression;Adjust position;Breast feeding basics reviewed;Skin to skin;Support pillows;DEBP;Breast massage;Position options;Hand express;Expressed milk;Education  Discharge    Consult Status Consult Status: Follow-up Date: 09/16/20 Follow-up type: In-patient    Zackarie Chason  Nicholson-Springer 09/15/2020, 12:45 PM

## 2020-09-15 NOTE — Social Work (Addendum)
CSW was informed by pediatrician that MOB had some social stressors  CSW followed-up with MOB to assess and offer resources.   CSW entered room and observed MOB in recliner with infant and Chaplain Katie present. Chaplain ended the conversation and exited the room. CSW asked MOB how she is doing. MOB reported her mom is currently in the hospital and she wants to be able visit. MOB reported infant currently needs to stay another night. CSW empathized with MOB. CSW asked MOB how she is feeling emotional. MOB disclosed she had a "breakdown" and knows its just hormones. MOB stated overall she is doing okay and denied any feelings of SI or HI. CSW asked MOB if she would like any additional mental health resources in the case of experiencing PPD or anxiety, MOB declined.  CSW discussed MOB having to have her room moved due to FOB knowing where she was previously. CSW asked MOB if she is safe at home. MOB reported FOB is not a threat and that she is safe at home. MOB stated there are no concerns regarding FOB and denied having any needs in reference to him. MOB reiterated that she has a strong support system and will feel supported at home.   CSW will continue to follow CDS and make a CPS report. CSW identifies no further need for intervention and no barriers to discharge at this time.  Manfred Arch, MSW, [employer] Clinical Social Work [employer] and [employer] [phone]

## 2020-09-15 NOTE — Discharge Instructions (Signed)
Postpartum Hypertension Postpartum hypertension is high blood pressure that is higher than normal after childbirth. It usually starts within 1 to 2 days after delivery, but it can happen at any time for up to 6 weeks after delivery. For some women, medical treatment is required to prevent serious complications, such as seizures or stroke. What are the causes? The cause of this condition is not well understood. In some cases, the cause may not be known. Certain conditions may increase your risk. These include:  Hypertension that existed before pregnancy (chronic hypertension).  Hypertension that comes as a result of pregnancy (gestational hypertension).  Hypertensive disorders during pregnancy or seizures in women who have high blood pressure during pregnancy. These conditions are called preeclampsia and eclampsia.  A condition in which the liver, platelets, and red blood cells are damaged during pregnancy (HELLP syndrome).  Obesity.  Diabetes. What are the signs or symptoms? As with all types of hypertension, postpartum hypertension may not have any symptoms. Depending on how high your blood pressure is, you may experience:  Headaches. These may be mild, moderate, or severe. They may also be steady, Mia Waters, or sudden in onset (thunderclap headache).  Vision changes, such as blurry vision, flashing lights, or seeing spots.  Nausea and vomiting.  Pain in the upper right side of your abdomen.  Shortness of breath.  Difficulty breathing while lying down.  A decrease in the amount of urine that you pass. How is this diagnosed? This condition may be diagnosed based on the results of a physical exam, blood pressure measurements, and blood and urine tests. You may also have other tests, such as a CT scan or an MRI, to check for other problems of postpartum hypertension. How is this treated? If blood pressure is high enough to require treatment, your options may include:  Medicines to  reduce blood pressure (antihypertensives). Tell your health care provider if you are breastfeeding or if you plan to breastfeed. There are many antihypertensive medicines that are safe to take while breastfeeding.  Treating medical conditions that are causing hypertension.  Treating the complications of hypertension, such as seizures, stroke, or kidney problems. Your health care provider will also continue to monitor your blood pressure closely until it is within a safe range for you. Follow these instructions at home: Learn your goal blood pressure Two numbers make up your blood pressure. The first number is called systolic pressure. The second is called diastolic pressure. An example of a blood pressure reading is "120 over 80" (or 120/80). For most people, goal blood pressure is:  First number: below 140.  Second number: below 90. Your blood pressure is above normal even if only the top or bottom number is above normal. Know what to do before you take your blood pressure 30 minutes before you check your blood pressure:  Do not drink caffeine.  Do not drink alcohol.  Avoid food and drink.  Do not smoke.  Do not exercise. 5 minutes before you check your blood pressure:  Use the bathroom and urinate so that you have an empty bladder.  Sit quietly in a dining room chair. Do not sit in a soft couch or an armchair. Do not talk. Know how to take your blood pressure To check your blood pressure, follow the instructions in the manual that came with your blood pressure monitor. If you have a digital blood pressure monitor, the instructions may be as follows: 1. Sit up straight. 2. Place your feet on the floor. Do   not cross your ankles or legs. 3. Rest your left arm at the level of your heart. You may rest it on a table, desk, or chair. 4. Pull up your shirt sleeve. 5. Wrap the blood pressure cuff around the upper part of your left arm. The cuff should be 1 inch (2.5 cm) above your  elbow. It is best to wrap the cuff around bare skin. 6. Fit the cuff snugly around your arm. You should be able to place only one finger between the cuff and your arm. 7. Put the cord inside the groove of your elbow. 8. Press the power button. 9. Sit quietly while the cuff fills with air and loses air. 10. Write down the numbers on the screen. These are your blood pressure readings. 11. Wait 1-2 minutes and then repeat steps 1-10.   Record your blood pressure readings Follow your health care provider's instructions on how to record your blood pressure readings. If you were asked to use this form, follow these instructions:  Get one reading in the morning (a.m.) before you take any medicines.  Get one reading in the evening (p.m.) before supper.  Take at least 2 readings with each blood pressure check. This makes sure the results are correct. Wait 1-2 minutes between measurements.  Write down the results in the spaces on this form. Date: _______________________  a.m. _____________________(1st reading) _____________________(2nd reading)  p.m. _____________________(1st reading) _____________________(2nd reading) Date: _______________________  a.m. _____________________(1st reading) _____________________(2nd reading)  p.m. _____________________(1st reading) _____________________(2nd reading) Date: _______________________  a.m. _____________________(1st reading) _____________________(2nd reading)  p.m. _____________________(1st reading) _____________________(2nd reading) Date: _______________________  a.m. _____________________(1st reading) _____________________(2nd reading)  p.m. _____________________(1st reading) _____________________(2nd reading) Date: _______________________  a.m. _____________________(1st reading) _____________________(2nd reading)  p.m. _____________________(1st reading) _____________________(2nd reading) General instructions  Take over-the-counter and  prescription medicines only as told by your health care provider.  Do not use any products that contain nicotine or tobacco. These products include cigarettes, chewing tobacco, and vaping devices, such as e-cigarettes. If you need help quitting, ask your health care provider.  Check your blood pressure as often as recommended by your health care provider.  Return to your normal activities as told by your health care provider. Ask your health care provider what activities are safe for you.  Keep all follow-up visits. This is important. Contact a health care provider if:  You have new symptoms, such as: ? A headache that does not get better. ? Dizziness. ? Visual changes. ? Nausea and vomiting. Get help right away if:  You develop difficulty breathing.  You have chest pain.  You faint.  You have any symptoms of a stroke. "BE FAST" is an easy way to remember the main warning signs of a stroke: ? B - Balance. Signs are dizziness, sudden trouble walking, or loss of balance. ? E - Eyes. Signs are trouble seeing or a sudden change in vision. ? F - Face. Signs are sudden weakness or numbness of the face, or the face or eyelid drooping on one side. ? A - Arms. Signs are weakness or numbness in an arm. This happens suddenly and usually on one side of the body. ? S - Speech. Signs are sudden trouble speaking, slurred speech, or trouble understanding what people say. ? T - Time. Time to call emergency services. Write down what time symptoms started.  You have other signs of a stroke, such as: ? A sudden, severe headache with no known cause. ? Nausea or vomiting. ? Seizure. These symptoms   may represent a serious problem that is an emergency. Do not wait to see if the symptoms will go away. Get medical help right away. Call your local emergency services (911 in the [place] Do not drive yourself to the hospital. Summary  Postpartum hypertension is high blood pressure that remains higher than  normal after childbirth.  For some women, medical treatment is required to prevent serious complications, such as seizures or stroke.  Follow your health care provider's instructions on how to record your blood pressure readings.  Keep all follow-up visits. This is important. This information is not intended to replace advice given to you by your health care provider. Make sure you discuss any questions you have with your health care provider. Document Revised: 03/10/2020 Document Reviewed: 03/10/2020 Elsevier Patient Education  2021 Elsevier Inc.  

## 2020-09-16 ENCOUNTER — Other Ambulatory Visit: Payer: Self-pay | Admitting: Obstetrics & Gynecology

## 2020-09-16 MED ORDER — LISINOPRIL 5 MG PO TABS: 5.0000 mg | ORAL_TABLET | Freq: Every day | ORAL | 1 refills | Status: DC

## 2020-09-16 MED ORDER — LISINOPRIL 10 MG PO TABS
5.0000 mg | ORAL_TABLET | Freq: Every day | ORAL | Status: DC
  Administered 2020-09-16: 5 mg via ORAL
  Filled 2020-09-16: qty 1

## 2020-09-16 MED FILL — LISINOPRIL 5 MG TABS: 5 | 20 days supply | Qty: 20 | Fill #0

## 2020-09-16 NOTE — Social Work (Signed)
CSW verbally consulted and informed MOB is tearful and concern of leaving AMA.   CSW met with MOB to assess and offer support. CSW observed Chaplain also present. CSW observed MOB feeding infant a bottle. MOB expressed she is ready to go home with infant, but there is concern of infant eating. MOB shared she also wants to see her mother who is currently hospitalized. MOB was understandable tearful as she spoke about her desire to breastfeed infant and her worry that infant will not latch after bottle feeding. CSW attempted to reassure MOB. CSW asked MOB how she will handle things if she is told infant has to stay another day. MOB being tearful as she reported she plans to continue to bottle feed infant as requested and she will continue to comply with staff's medical request.  CSW assessed MOB current mood. MOB denies any depressive symptoms at this time.   CSW filed notification with [place] DSS of positive UDS. CSW will be notified if report is accpted. CSW continuing to follow CDS and make an additional CPS report if warranted.  CSW identifies no further need for intervention and no barriers to discharge at this time.  Chaney Johnson, LCSWA Clinical Social Work [facility] [phone]  

## 2020-09-16 NOTE — Progress Notes (Signed)
Discharge instructions and prescriptions given to pt. Discussed post vaginal delivery care, signs and symptoms to report to the MD, upcoming appointments, and meds. Pt verbalizes understanding and has no questions at this time. Pt states she has a BP cuff being delivered to her home.

## 2020-09-16 NOTE — Progress Notes (Addendum)
Patient BP at 1937 was 166/89. 15 minute recheck was 152/84. Dr. Debroah Loop consulted as pt was intended to be discharged and he recommended giving evening dose of Procardia 60mg  and rechecking BP at 2300 prior to discharge.   BP at 2241 was 164/93 and at 2243 was 153/88. Dr. consulted again and recommended no discharge this PM with reevaluation in the morning.   Headache present, no visual changes, no RUQ pain, +2 reflexes, no clonus. RN will continue to monitor.   Debroah Loop, RN

## 2020-09-16 NOTE — Progress Notes (Signed)
Discharge instructions reviewed with pt. Pt denies any questions or concerns. Pt pushed in wheelchair with infant in arm to vehicle. Infant placed in car seat.

## 2020-09-16 NOTE — Discharge Summary (Signed)
Postpartum Discharge Summary      Patient Name: Mia Waters DOB: 1981/01/09 MRN: [id number]  Date of admission: 09/11/2020 Delivery date:09/12/2020  Delivering provider: Verneda Skill  Date of discharge: 09/16/2020  Admitting diagnosis: Supervision of high risk pregnancy, antepartum [O09.90] Intrauterine pregnancy: [redacted]w[redacted]d    Secondary diagnosis:  Active Problems:   Umbilical hernia, incarcerated   GERD (gastroesophageal reflux disease)   Chronic pain   Chronic hypertension affecting pregnancy   Smoker   Late prenatal care   Supervision of high risk pregnancy, antepartum   Anemia of mother in pregnancy, postpartum condition  Additional problems: cHTN with SIPE with severe features (headache)    Discharge diagnosis: Term Pregnancy Delivered, Preeclampsia (severe) and CHTN with superimposed preeclampsia                                              Augmentation: Pitocin Complications: None  Hospital course: Induction of Labor With Vaginal Delivery   40y.o. yo [id number] admitted to the hospital 09/11/2020 for induction of labor.  Indication for induction: cHTN.  Patient had an uncomplicated labor course as follows: Membrane Rupture Time/Date: 10:44 PM ,09/11/2020   Delivery Method:Vaginal, Spontaneous  Episiotomy: None  Lacerations:  None  Details of delivery can be found in separate delivery note.  Patient had a routine postpartum course and was discharged form on procardia 60 mg BID and lisinopril. Patient is discharged home 09/16/20.  Newborn Data: Birth date:09/12/2020  Birth time:11:34 AM  Gender:Female  Living status:Living  Apgars:5 ,9  Weight:3881 g   Magnesium Sulfate received: Yes: Seizure prophylaxis BMZ received: No Rhophylac:N/A MMR:N/A Transfusion:No  Physical exam  Vitals:   09/15/20 2241 09/15/20 2243 09/16/20 0547 09/16/20 0858  BP: (!) 164/93 (!) 153/88 (!) 141/75 (!) 153/89  Pulse: 87 83 80 87  Resp:  18 18 18   Temp:   97.6 F (36.4  C) 97.9 F (36.6 C)  TempSrc:   Oral Oral  SpO2:  98% 96% 98%  Weight:      Height:       General: alert, cooperative and no distress Lochia: appropriate Uterine Fundus: firm DVT Evaluation: No evidence of DVT seen on physical exam. No cords or calf tenderness. Labs: Lab Results  Component Value Date   WBC 22.4 (H) 09/13/2020   HGB 9.2 (L) 09/13/2020   HCT 27.8 (L) 09/13/2020   MCV 89.1 09/13/2020   PLT 307 09/13/2020   CMP Latest Ref Rng & Units 09/13/2020  Glucose 70 - 99 mg/dL 108(H)  BUN 6 - 20 mg/dL 5(L)  Creatinine 0.44 - 1.00 mg/dL 0.68  Sodium 135 - 145 mmol/L 135  Potassium 3.5 - 5.1 mmol/L 3.7  Chloride 98 - 111 mmol/L 105  CO2 22 - 32 mmol/L 23  Calcium 8.9 - 10.3 mg/dL 7.7(L)  Total Protein 6.5 - 8.1 g/dL 5.4(L)  Total Bilirubin 0.3 - 1.2 mg/dL 0.3  Alkaline Phos 38 - 126 U/L 57  AST 15 - 41 U/L 18  ALT 0 - 44 U/L 14   Edinburgh Score: Edinburgh Postnatal Depression Scale Screening Tool 09/12/2020  I have been able to laugh and see the funny side of things. 0  I have looked forward with enjoyment to things. 1  I have blamed myself unnecessarily when things went wrong. 0  I have been anxious or  worried for no good reason. 0  I have felt scared or panicky for no good reason. 1  Things have been getting on top of me. 1  I have been so unhappy that I have had difficulty sleeping. 0  I have felt sad or miserable. 0  I have been so unhappy that I have been crying. 0  The thought of harming myself has occurred to me. 0  Edinburgh Postnatal Depression Scale Total 3     After visit meds:  Allergies as of 09/16/2020   No Known Allergies     Medication List    STOP taking these medications   aspirin EC 81 MG tablet   Doxylamine-Pyridoxine 10-10 MG Tbec Commonly known as: Diclegis   labetalol 200 MG tablet Commonly known as: NORMODYNE     TAKE these medications   buprenorphine 2 MG Subl SL tablet Commonly known as: SUBUTEX buprenorphine HCl 2 mg  sublingual tablet  Place 1 tablet every day by sublingual route for 5 days.   ferrous sulfate 325 (65 FE) MG tablet Take 1 tablet (325 mg total) by mouth every other day.   ibuprofen 600 MG tablet Commonly known as: ADVIL Take 1 tablet (600 mg total) by mouth every 6 (six) hours.   lisinopril 5 MG tablet Commonly known as: ZESTRIL Take 1 tablet (5 mg total) by mouth daily.   NIFEdipine 60 MG 24 hr tablet Commonly known as: ADALAT CC Take 1 tablet (60 mg total) by mouth 2 (two) times daily.   PRENATAL VITAMIN PO Take by mouth.        Discharge home in stable condition Infant Feeding: Breast Infant Disposition:rooming in Discharge instruction: per After Visit Summary and Postpartum booklet. Activity: Advance as tolerated. Pelvic rest for 6 weeks.  Diet: low salt diet Future Appointments: Future Appointments  Date Time Provider [place]  09/18/2020 10:10 AM [facility] NURSE [facility]  10/20/2020 11:50 AM Roma Schanz, CNM [facility]   Follow up Visit:  Follow-up Information    [facility] Follow up on 09/18/2020.   Why: For incision and blood pressure check Contact information: [address] [place] [id number] [phone]               Please schedule this patient for a In person postpartum visit in 4 weeks with the following provider: Any provider. Additional Postpartum F/U:BP check 1 week  High risk pregnancy complicated by: HTN Delivery mode:  Vaginal, Spontaneous  Anticipated Birth Control:  Unsure PP message sent 09/12/20  09/16/2020 Mora Bellman, MD

## 2020-09-16 NOTE — Lactation Note (Signed)
This note was copied from a baby's chart. Lactation Consultation Note  Patient Name: Mia Waters [id number] Date: 09/16/2020 Reason for consult: Follow-up assessment Age:38 days   RN requested [facility] consult Mom. Mom concerned as she broke down and gave baby a bottle of EBM followed by formula.    Baby has settled down and is sleeping swaddled in crib. Mom is pumping both breasts and milk volume is increasing.   Gave Mom lactation brochure and identified phone numbers for lactation assistance and to make an appointment for OP consult.   Encouraged STS and frequent double pumping if baby will not latch and feed at breast.  Reassured Mom about importance of baby taking her breast milk to maintain her growth and withdrawal symptoms.    Mom verbalized she was appreciative of help and reassurance.  Feeding Nipple Type: Slow - flow  LATCH Score Latch: Repeated attempts needed to sustain latch, nipple held in mouth throughout feeding, stimulation needed to elicit sucking reflex. (baby extremetly fussy until nipple shield and supplement at breast with 5 fr feeding tube)  Audible Swallowing: Spontaneous and intermittent (with supplement at breast)  Type of Nipple: Everted at rest and after stimulation  Comfort (Breast/Nipple): Soft / non-tender  Hold (Positioning): Full assist, staff holds infant at breast  LATCH Score: 7   Lactation Tools Discussed/Used Pumping frequency: Q3 Pumped volume: 10 mL  Interventions Interventions: Breast feeding basics reviewed;Assisted with latch;Breast compression;Adjust position;Support pillows;Position options;DEBP;Education  Discharge Pump: Personal (Spectra DEBP from insurance co)  Consult Status Consult Status: Follow-up Date: 09/17/20 Follow-up type: In-patient    [name] 09/16/2020, 1:37 PM

## 2020-09-16 NOTE — Progress Notes (Signed)
I offered support to Mia Waters over the past two days over a period of several hours. She has been very concerned about being able to see her mother who is in [facility].  I helped her think through options of being able to see her in person or virtually with the support of nursery care here since [place] couldn't discharge yet. She did not want to leave [name] here because her birth experience was very upsetting to her.  She felt that her mother would not want her to leave [name] and so she decided to try to do a virtual visit with her mom through her brother. I provided listening support and facilitated sharing of feelings and story as she processed her birth experience and the differences between this postpartum period and her others.   Chaplain Dyanne Carrel, [facility] Pager, [phone] 5:23 PM

## 2020-09-16 NOTE — Lactation Note (Signed)
This note was copied from a baby's chart. Lactation Consultation Note  Patient Name: Mia Waters [id number] Date: 09/16/2020 Reason for consult: Follow-up assessment Age:38 days   LC in to assist/assess with positioning and latching baby.   Baby currently 10% weight loss, basically the same as yesterday's weight.  Mom states she has been supplementing 15 ml after each feeding, but was told by [facility] to take a break overnight.  Baby's last supplement was at 11 pm for 4 ml formula.  Baby fed every 1-2 hrs overnight.  Mom exhausted and teary eyed.  Mom laying on her side in bed and trying to latch baby to the breast.  Baby screaming and refusing to latch.  Mom upset because baby was given a pacifier to settle in crib as Mom had baby sleeping in the bed with her.    LC attempted to help Mom latch baby.  Had baby suckle on finger to entice and LC noted suck to be weak.    Initiated a 20 mm nipple shield, showing Mom how to properly apply to breast.  Baby able to latch and feed with 5 fr feeding tube and 24 cal formula.  Baby able to latch deeply with flow of supplement for 20 mins and 16 ml.   Set up DEBP after washing all the pump parts and explaining to Mom importance of this after every pumping.    Baby very fussy and NP Bethann Humble in to assess baby for sx of withdrawal.   Plan- 1- Offer breast with cues, or at least every 3hrs.  Use nipple shield to help with latch prn 2- supplement using EBM+/24 cal formula at breast with SNS or by paced bottle 3- pump both breasts 15 mins adding breast massage and hand expression. 4- ask for help prn.   LATCH Score Latch: Repeated attempts needed to sustain latch, nipple held in mouth throughout feeding, stimulation needed to elicit sucking reflex. (baby extremetly fussy until nipple shield and supplement at breast with 5 fr feeding tube)  Audible Swallowing: Spontaneous and intermittent (with supplement at breast)  Type of Nipple: Everted at rest and  after stimulation  Comfort (Breast/Nipple): Soft / non-tender  Hold (Positioning): Full assist, staff holds infant at breast  LATCH Score: 7   Lactation Tools Discussed/Used Pumping frequency: Q3 Pumped volume: 10 mL  Interventions Interventions: Breast feeding basics reviewed;Assisted with latch;Breast compression;Adjust position;Support pillows;Position options;DEBP;Education  Discharge Pump: Personal (Spectra DEBP from insurance co)  Consult Status Consult Status: Follow-up Date: 09/17/20 Follow-up type: In-patient    Mia Waters 09/16/2020, 11:23 AM

## 2020-09-17 IMAGING — MR MRI CERVICAL SPINE WITHOUT CONTRAST
4 of 5 series · 16 of 48 positions shown · non-contrast
Comparison: None.

CLINICAL DATA: Neck pain for 21 years

EXAM:
MRI CERVICAL SPINE WITHOUT CONTRAST
TECHNIQUE: Multiplanar, multisequence MR imaging of the cervical spine was
performed. No intravenous contrast was administered.

[Series 3: T2 · sagittal · 3.0mm · 0.44mm/px · 6 of 15 slices shown (1 of 2)]
[im 1/15]
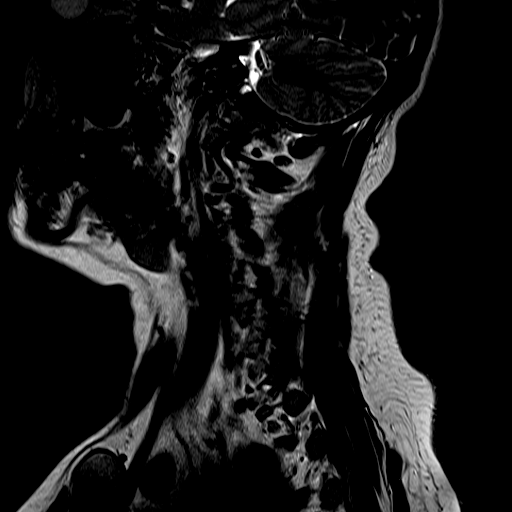
[im 3/15]
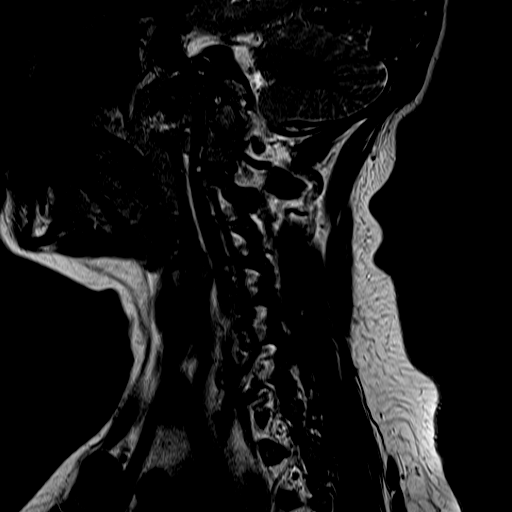
[im 6/15]
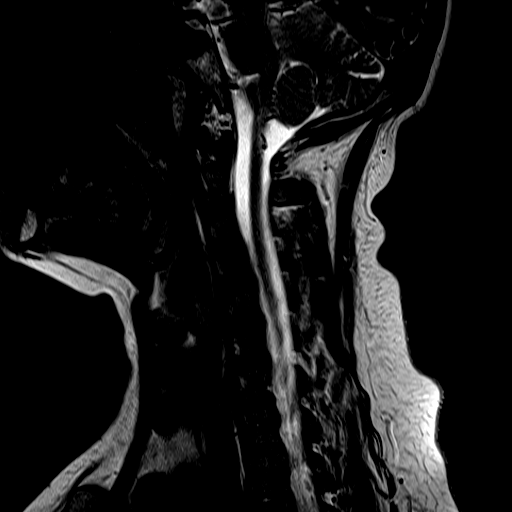
[im 9/15]
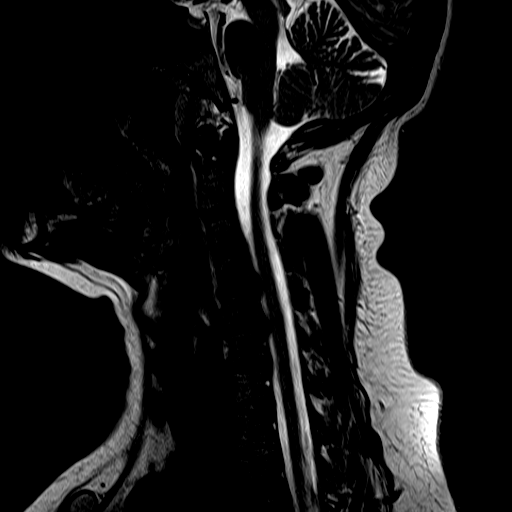
[im 12/15]
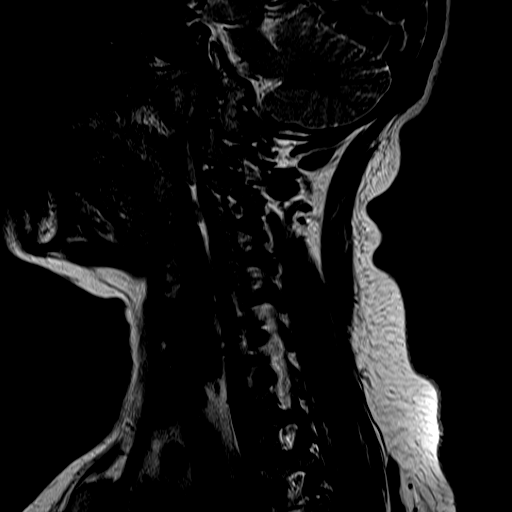
[im 15/15]
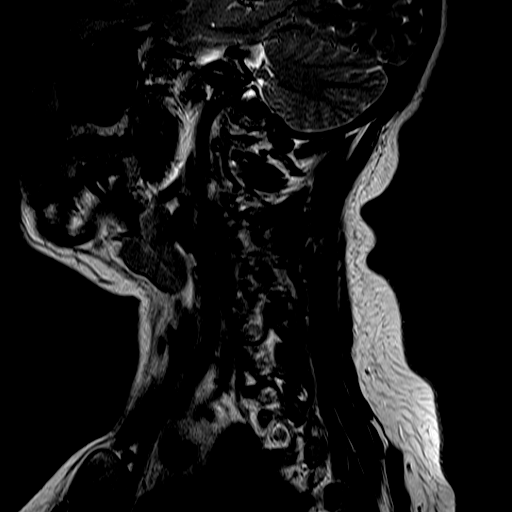

[Series 4: FLAIR · sagittal · 3.0mm · 0.50mm/px · 3 of 15 slices shown]
[im 3/15]
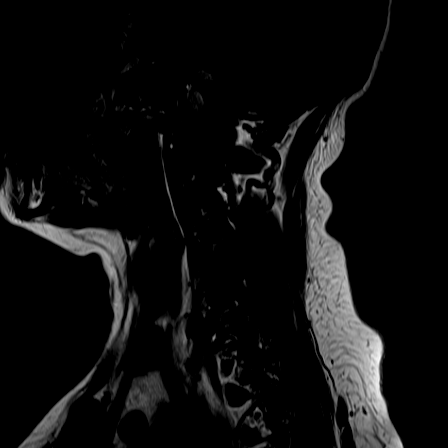
[im 9/15]
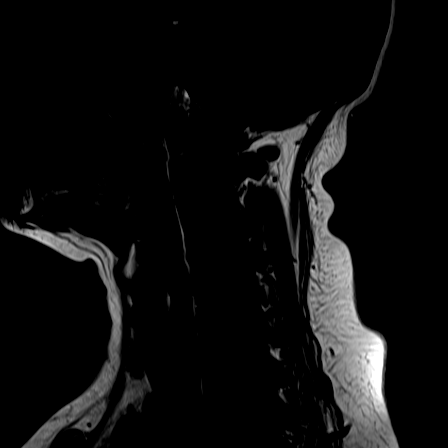
[im 15/15]
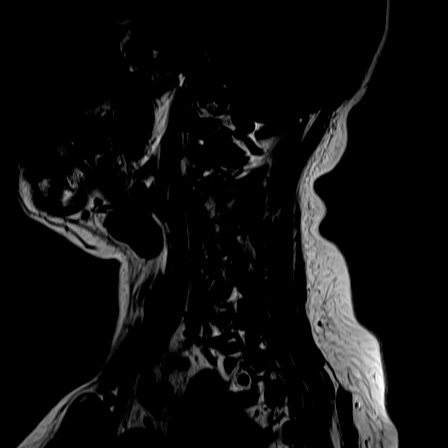

[Series 5: STIR · sagittal · 3.0mm · 0.29mm/px · 3 of 15 slices shown]
[im 3/15]
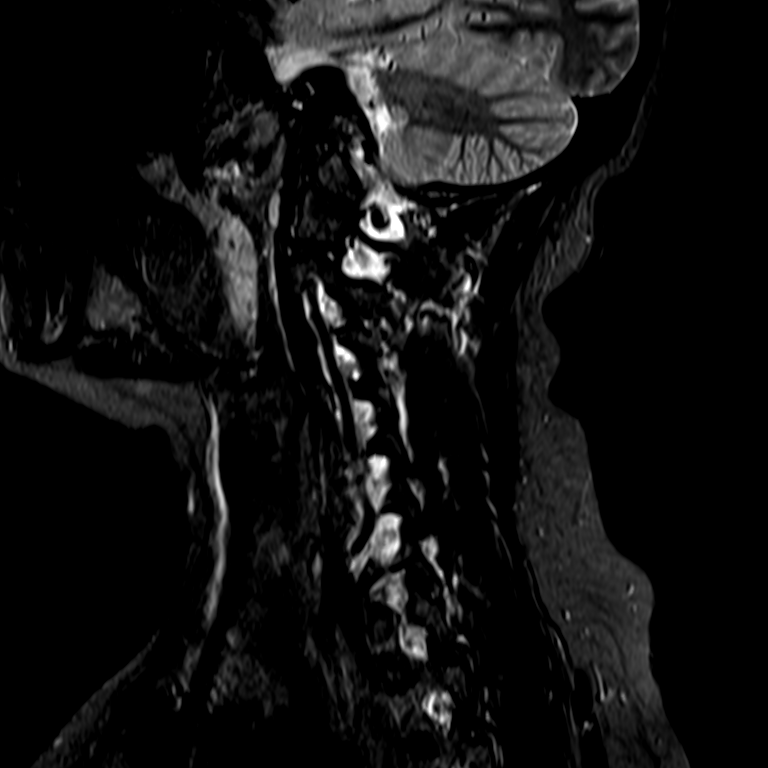
[im 9/15]
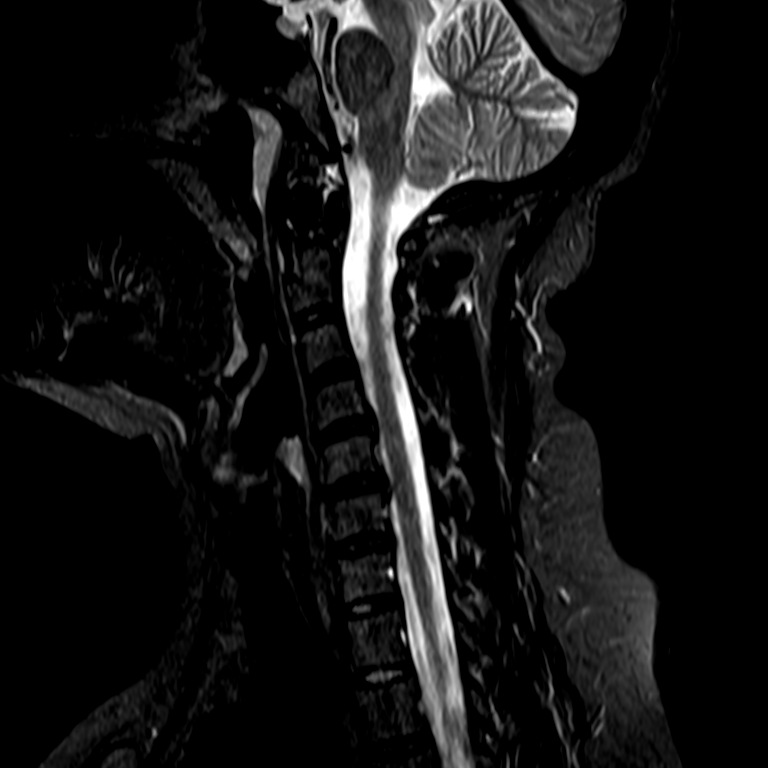
[im 15/15]
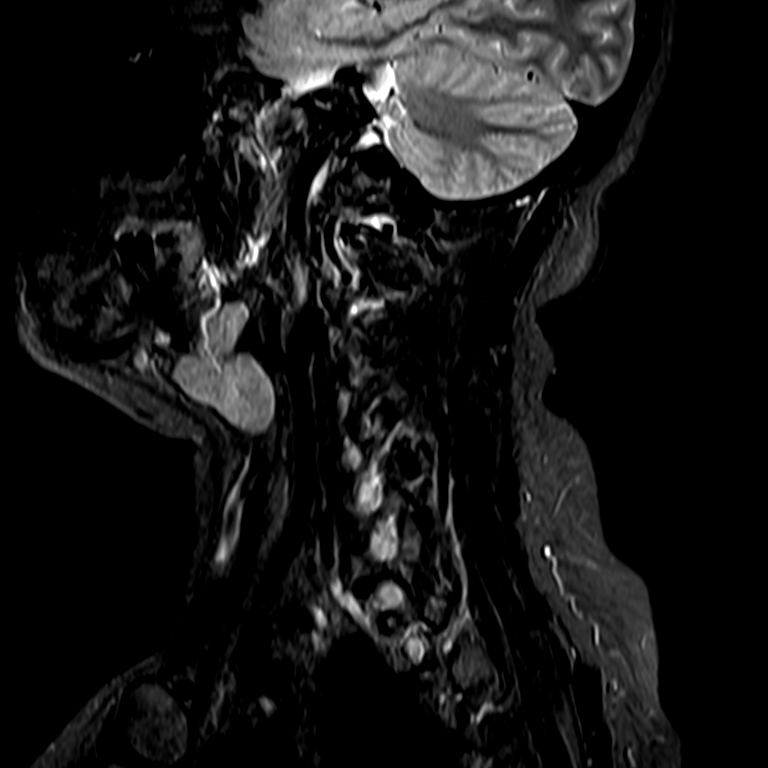

[Series 6: T2 · axial · 3.0mm · 0.24mm/px · z∈[-75,+21]mm · 4 of 36 slices shown (2 of 2)]
[im 1/36]
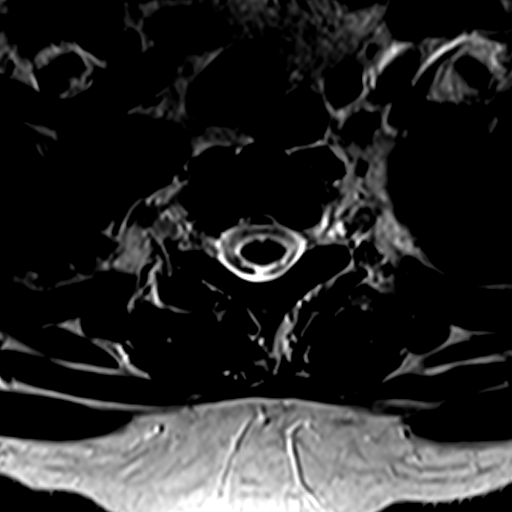
[im 6/36]
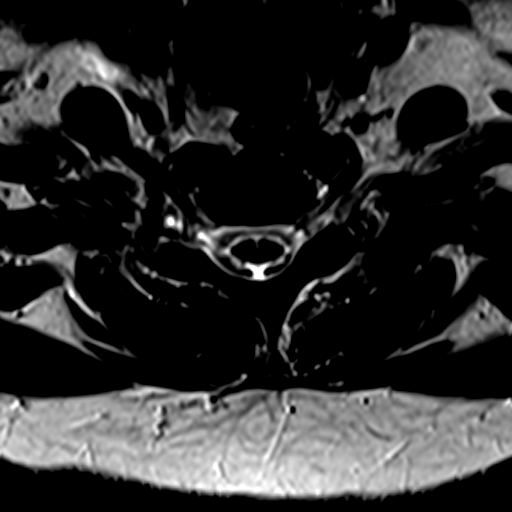
[im 18/36]
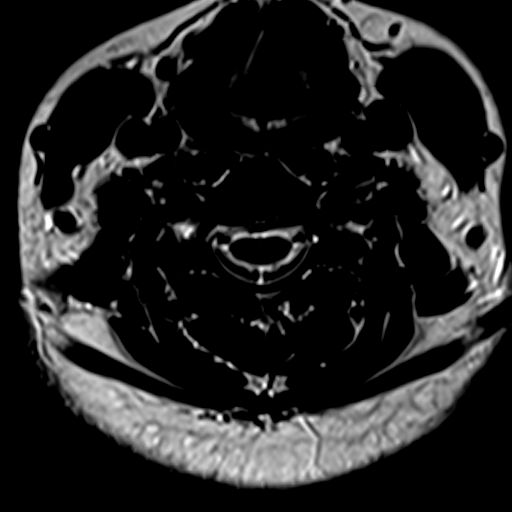
[im 31/36]
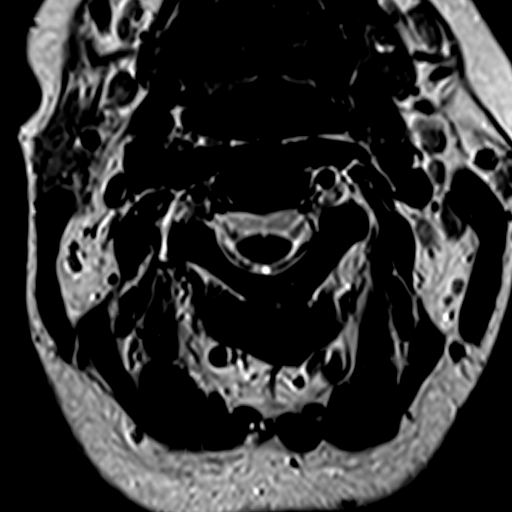

[16 of 48 positions shown; findings below may reference images not displayed]

FINDINGS: Alignment: Physiologic.

Vertebrae: No fracture, evidence of discitis, or bone lesion.

Cord: Normal signal and morphology.

Posterior Fossa, vertebral arteries, paraspinal tissues: Posterior
fossa demonstrates no focal abnormality. Vertebral artery flow voids
are maintained. Paraspinal soft tissues are unremarkable.

Disc levels:

Discs: Disc spaces are relatively well maintained.

C2-3: No significant disc bulge. No neural foraminal stenosis. No
central canal stenosis.

C3-4: No significant disc bulge. No neural foraminal stenosis. No
central canal stenosis.

C4-5: Minimal broad-based disc bulge. No neural foraminal stenosis.
No central canal stenosis.

C5-6: Mild broad-based disc bulge with a small central disc
protrusion. No neural foraminal stenosis. No central canal stenosis.

C6-7: Minimal broad-based disc bulge with a tiny left paracentral
disc protrusion. No neural foraminal stenosis. No central canal
stenosis.

C7-T1: No significant disc bulge. No neural foraminal stenosis. No
central canal stenosis.
IMPRESSION: 1. Mild cervical spine spondylosis as detailed above.
2.  No acute osseous injury of the cervical spine.

## 2020-10-20 ENCOUNTER — Ambulatory Visit: Payer: Medicaid Other | Admitting: Women's Health

## 2022-07-01 ENCOUNTER — Other Ambulatory Visit: Payer: Self-pay | Admitting: Physical Medicine & Rehabilitation

## 2022-07-01 DIAGNOSIS — M5416 Radiculopathy, lumbar region: Secondary | ICD-10-CM

## 2022-07-28 ENCOUNTER — Other Ambulatory Visit: Payer: Self-pay | Admitting: Physical Medicine & Rehabilitation

## 2022-07-28 ENCOUNTER — Ambulatory Visit
Admission: RE | Admit: 2022-07-28 | Discharge: 2022-07-28 | Disposition: A | Discharge status: Home / Self Care | Payer: Medicaid Other | Source: Ambulatory Visit | Attending: Physical Medicine & Rehabilitation | Admitting: Physical Medicine & Rehabilitation

## 2022-07-28 DIAGNOSIS — M542 Cervicalgia: Secondary | ICD-10-CM

## 2022-08-09 ENCOUNTER — Ambulatory Visit
Admission: RE | Admit: 2022-08-09 | Discharge: 2022-08-09 | Disposition: A | Discharge status: Home / Self Care | Payer: Medicaid Other | Source: Ambulatory Visit | Attending: Physical Medicine & Rehabilitation | Admitting: Physical Medicine & Rehabilitation

## 2022-08-09 DIAGNOSIS — M5416 Radiculopathy, lumbar region: Secondary | ICD-10-CM

## 2023-02-05 ENCOUNTER — Encounter (HOSPITAL_COMMUNITY)
Admission: EM | Disposition: A | Discharge status: Home / Self Care | Payer: Self-pay | Source: Home / Self Care | Attending: General Surgery

## 2023-02-05 ENCOUNTER — Inpatient Hospital Stay (HOSPITAL_COMMUNITY): Payer: Medicaid Other

## 2023-02-05 ENCOUNTER — Other Ambulatory Visit: Payer: Self-pay

## 2023-02-05 ENCOUNTER — Inpatient Hospital Stay (HOSPITAL_COMMUNITY)
Admission: EM | Admit: 2023-02-05 | Discharge: 2023-02-13 | DRG: 330 | Disposition: A | Discharge status: Home / Self Care | Payer: Medicaid Other | Attending: General Surgery | Admitting: General Surgery

## 2023-02-05 ENCOUNTER — Emergency Department (HOSPITAL_COMMUNITY): Payer: Medicaid Other | Admitting: Anesthesiology

## 2023-02-05 ENCOUNTER — Emergency Department (HOSPITAL_COMMUNITY): Payer: Medicaid Other

## 2023-02-05 DIAGNOSIS — F419 Anxiety disorder, unspecified: Secondary | ICD-10-CM | POA: Diagnosis present

## 2023-02-05 DIAGNOSIS — R739 Hyperglycemia, unspecified: Secondary | ICD-10-CM | POA: Diagnosis present

## 2023-02-05 DIAGNOSIS — F17210 Nicotine dependence, cigarettes, uncomplicated: Secondary | ICD-10-CM | POA: Diagnosis not present

## 2023-02-05 DIAGNOSIS — G4700 Insomnia, unspecified: Secondary | ICD-10-CM | POA: Diagnosis not present

## 2023-02-05 DIAGNOSIS — Z823 Family history of stroke: Secondary | ICD-10-CM | POA: Diagnosis not present

## 2023-02-05 DIAGNOSIS — G894 Chronic pain syndrome: Secondary | ICD-10-CM | POA: Diagnosis present

## 2023-02-05 DIAGNOSIS — E871 Hypo-osmolality and hyponatremia: Secondary | ICD-10-CM | POA: Diagnosis not present

## 2023-02-05 DIAGNOSIS — E860 Dehydration: Secondary | ICD-10-CM | POA: Diagnosis present

## 2023-02-05 DIAGNOSIS — Z6838 Body mass index (BMI) 38.0-38.9, adult: Secondary | ICD-10-CM

## 2023-02-05 DIAGNOSIS — I10 Essential (primary) hypertension: Secondary | ICD-10-CM | POA: Diagnosis not present

## 2023-02-05 DIAGNOSIS — K581 Irritable bowel syndrome with constipation: Secondary | ICD-10-CM | POA: Diagnosis present

## 2023-02-05 DIAGNOSIS — M797 Fibromyalgia: Secondary | ICD-10-CM | POA: Diagnosis present

## 2023-02-05 DIAGNOSIS — F515 Nightmare disorder: Secondary | ICD-10-CM | POA: Diagnosis not present

## 2023-02-05 DIAGNOSIS — T426X5A Adverse effect of other antiepileptic and sedative-hypnotic drugs, initial encounter: Secondary | ICD-10-CM | POA: Diagnosis not present

## 2023-02-05 DIAGNOSIS — K3532 Acute appendicitis with perforation and localized peritonitis, without abscess: Secondary | ICD-10-CM | POA: Diagnosis not present

## 2023-02-05 DIAGNOSIS — Z825 Family history of asthma and other chronic lower respiratory diseases: Secondary | ICD-10-CM

## 2023-02-05 DIAGNOSIS — K567 Ileus, unspecified: Secondary | ICD-10-CM | POA: Diagnosis not present

## 2023-02-05 DIAGNOSIS — Z820 Family history of epilepsy and other diseases of the nervous system: Secondary | ICD-10-CM | POA: Diagnosis not present

## 2023-02-05 DIAGNOSIS — B9620 Unspecified Escherichia coli [E. coli] as the cause of diseases classified elsewhere: Secondary | ICD-10-CM | POA: Diagnosis present

## 2023-02-05 DIAGNOSIS — E876 Hypokalemia: Secondary | ICD-10-CM | POA: Diagnosis present

## 2023-02-05 DIAGNOSIS — J45909 Unspecified asthma, uncomplicated: Secondary | ICD-10-CM | POA: Diagnosis not present

## 2023-02-05 DIAGNOSIS — Z1152 Encounter for screening for COVID-19: Secondary | ICD-10-CM | POA: Diagnosis not present

## 2023-02-05 DIAGNOSIS — Z8249 Family history of ischemic heart disease and other diseases of the circulatory system: Secondary | ICD-10-CM

## 2023-02-05 DIAGNOSIS — K219 Gastro-esophageal reflux disease without esophagitis: Secondary | ICD-10-CM | POA: Diagnosis present

## 2023-02-05 DIAGNOSIS — Z79899 Other long term (current) drug therapy: Secondary | ICD-10-CM

## 2023-02-05 DIAGNOSIS — Z833 Family history of diabetes mellitus: Secondary | ICD-10-CM

## 2023-02-05 DIAGNOSIS — F17290 Nicotine dependence, other tobacco product, uncomplicated: Secondary | ICD-10-CM | POA: Diagnosis present

## 2023-02-05 DIAGNOSIS — Y92239 Unspecified place in hospital as the place of occurrence of the external cause: Secondary | ICD-10-CM | POA: Diagnosis not present

## 2023-02-05 DIAGNOSIS — K668 Other specified disorders of peritoneum: Secondary | ICD-10-CM | POA: Diagnosis not present

## 2023-02-05 DIAGNOSIS — R000 Tachycardia, unspecified: Secondary | ICD-10-CM | POA: Diagnosis present

## 2023-02-05 HISTORY — PX: APPENDECTOMY: SHX54

## 2023-02-05 HISTORY — PX: LAPAROTOMY: SHX154

## 2023-02-05 LAB — BASIC METABOLIC PANEL
Anion gap: 11 (ref 5–15)
BUN: 19 mg/dL (ref 6–20)
CO2: 27 mmol/L (ref 22–32)
Calcium: 7.9 mg/dL — ABNORMAL LOW (ref 8.9–10.3)
Chloride: 93 mmol/L — ABNORMAL LOW (ref 98–111)
Creatinine, Ser: 0.76 mg/dL (ref 0.44–1.00)
GFR, Estimated: >60 mL/min (ref >60)
Glucose, Bld: 126 mg/dL — ABNORMAL HIGH (ref 70–99)
Potassium: 3.1 mmol/L — ABNORMAL LOW (ref 3.5–5.1)
Sodium: 131 mmol/L — ABNORMAL LOW (ref 135–145)

## 2023-02-05 LAB — CBC
HCT: 35.9 % — ABNORMAL LOW (ref 36.0–46.0)
Hemoglobin: 12.4 g/dL (ref 12.0–15.0)
MCH: 29.5 pg (ref 26.0–34.0)
MCHC: 34.5 g/dL (ref 30.0–36.0)
MCV: 85.3 fL (ref 80.0–100.0)
Platelets: 397 10*3/uL (ref 150–400)
RBC: 4.21 MIL/uL (ref 3.87–5.11)
RDW: 14.9 % (ref 11.5–15.5)
WBC: 24.9 10*3/uL — ABNORMAL HIGH (ref 4.0–10.5)
nRBC: 0 % (ref 0.0–0.2)

## 2023-02-05 LAB — CULTURE, BLOOD (ROUTINE X 2)
Culture: NO GROWTH
Culture: NO GROWTH
Special Requests: ADEQUATE
Special Requests: ADEQUATE

## 2023-02-05 LAB — URINALYSIS, ROUTINE W REFLEX MICROSCOPIC
Bilirubin Urine: NEGATIVE
Glucose, UA: NEGATIVE mg/dL
Ketones, ur: 5 mg/dL — AB
Leukocytes,Ua: NEGATIVE
Nitrite: NEGATIVE
Protein, ur: 100 mg/dL — AB
Specific Gravity, Urine: 1.026 (ref 1.005–1.030)
pH: 6 (ref 5.0–8.0)

## 2023-02-05 LAB — COMPREHENSIVE METABOLIC PANEL
ALT: 21 U/L (ref 0–44)
AST: 52 U/L — ABNORMAL HIGH (ref 15–41)
Albumin: 2.8 g/dL — ABNORMAL LOW (ref 3.5–5.0)
Alkaline Phosphatase: 50 U/L (ref 38–126)
Anion gap: 13 (ref 5–15)
BUN: 25 mg/dL — ABNORMAL HIGH (ref 6–20)
CO2: 29 mmol/L (ref 22–32)
Calcium: 8.4 mg/dL — ABNORMAL LOW (ref 8.9–10.3)
Chloride: 88 mmol/L — ABNORMAL LOW (ref 98–111)
Creatinine, Ser: 0.8 mg/dL (ref 0.44–1.00)
GFR, Estimated: >60 mL/min (ref >60)
Glucose, Bld: 124 mg/dL — ABNORMAL HIGH (ref 70–99)
Potassium: 2.6 mmol/L — CL (ref 3.5–5.1)
Sodium: 130 mmol/L — ABNORMAL LOW (ref 135–145)
Total Bilirubin: 0.7 mg/dL (ref 0.3–1.2)
Total Protein: 6.6 g/dL (ref 6.5–8.1)

## 2023-02-05 LAB — PROTIME-INR
INR: 1.2 (ref 0.8–1.2)
Prothrombin Time: 15.9 seconds — ABNORMAL HIGH (ref 11.4–15.2)

## 2023-02-05 LAB — LACTIC ACID, PLASMA: Lactic Acid, Venous: 0.9 mmol/L (ref 0.5–1.9)

## 2023-02-05 LAB — BLOOD GAS, ARTERIAL
Acid-Base Excess: 4.9 mmol/L — ABNORMAL HIGH (ref 0.0–2.0)
Bicarbonate: 29.2 mmol/L — ABNORMAL HIGH (ref 20.0–28.0)
Drawn by: 27016
O2 Saturation: 98.8 %
Patient temperature: 37.1
pCO2 arterial: 41 mmHg (ref 32–48)
pH, Arterial: 7.46 — ABNORMAL HIGH (ref 7.35–7.45)
pO2, Arterial: 103 mmHg (ref 83–108)

## 2023-02-05 LAB — MAGNESIUM: Magnesium: 2.1 mg/dL (ref 1.7–2.4)

## 2023-02-05 LAB — AEROBIC/ANAEROBIC CULTURE W GRAM STAIN (SURGICAL/DEEP WOUND)

## 2023-02-05 LAB — TYPE AND SCREEN
ABO/RH(D): A POS
Antibody Screen: NEGATIVE

## 2023-02-05 LAB — HCG, SERUM, QUALITATIVE: Preg, Serum: NEGATIVE

## 2023-02-05 LAB — GLUCOSE, CAPILLARY
Glucose-Capillary: 131 mg/dL — ABNORMAL HIGH (ref 70–99)
Glucose-Capillary: 134 mg/dL — ABNORMAL HIGH (ref 70–99)
Glucose-Capillary: 149 mg/dL — ABNORMAL HIGH (ref 70–99)

## 2023-02-05 LAB — APTT: aPTT: 26 seconds (ref 24–36)

## 2023-02-05 LAB — SARS CORONAVIRUS 2 BY RT PCR: SARS Coronavirus 2 by RT PCR: NEGATIVE

## 2023-02-05 LAB — LIPASE, BLOOD: Lipase: 18 U/L (ref 11–51)

## 2023-02-05 LAB — POC URINE PREG, ED: Preg Test, Ur: NEGATIVE

## 2023-02-05 SURGERY — LAPAROTOMY, EXPLORATORY
Anesthesia: General | Site: Abdomen

## 2023-02-05 MED ORDER — PROPOFOL 1000 MG/100ML IV EMUL
5.0000 ug/kg/min | INTRAVENOUS | Status: DC
  Administered 2023-02-05: 50 ug/kg/min via INTRAVENOUS
  Administered 2023-02-05: 75 ug/kg/min via INTRAVENOUS
  Administered 2023-02-05 – 2023-02-06 (×2): 80 ug/kg/min via INTRAVENOUS
  Filled 2023-02-05: qty 200
  Filled 2023-02-05 (×2): qty 100
  Filled 2023-02-05: qty 200

## 2023-02-05 MED ORDER — FENTANYL CITRATE PF 50 MCG/ML IJ SOSY
50.0000 ug | PREFILLED_SYRINGE | INTRAMUSCULAR | Status: AC | PRN
  Administered 2023-02-05 (×3): 50 ug via INTRAVENOUS
  Filled 2023-02-05 (×3): qty 1

## 2023-02-05 MED ORDER — CHLORHEXIDINE GLUCONATE CLOTH 2 % EX PADS
6.0000 | MEDICATED_PAD | Freq: Once | CUTANEOUS | Status: AC
  Administered 2023-02-05: 6 via TOPICAL

## 2023-02-05 MED ORDER — MIDAZOLAM HCL 5 MG/5ML IJ SOLN
INTRAMUSCULAR | Status: DC | PRN
  Administered 2023-02-05 (×3): 2 mg via INTRAVENOUS

## 2023-02-05 MED ORDER — PHENYLEPHRINE 80 MCG/ML (10ML) SYRINGE FOR IV PUSH (FOR BLOOD PRESSURE SUPPORT)
PREFILLED_SYRINGE | INTRAVENOUS | Status: AC
  Filled 2023-02-05: qty 10

## 2023-02-05 MED ORDER — ONDANSETRON HCL 4 MG/2ML IJ SOLN
4.0000 mg | Freq: Four times a day (QID) | INTRAMUSCULAR | Status: DC | PRN
  Administered 2023-02-06 – 2023-02-08 (×6): 4 mg via INTRAVENOUS
  Filled 2023-02-05 (×7): qty 2

## 2023-02-05 MED ORDER — ONDANSETRON HCL 4 MG/2ML IJ SOLN
4.0000 mg | Freq: Once | INTRAMUSCULAR | Status: AC | PRN
  Administered 2023-02-05: 4 mg via INTRAVENOUS
  Filled 2023-02-05: qty 2

## 2023-02-05 MED ORDER — ONDANSETRON HCL 4 MG/2ML IJ SOLN: 4.0000 mg | Freq: Once | INTRAMUSCULAR | Status: DC

## 2023-02-05 MED ORDER — LACTATED RINGERS IV BOLUS
1000.0000 mL | Freq: Once | INTRAVENOUS | Status: AC
  Administered 2023-02-05: 1000 mL via INTRAVENOUS

## 2023-02-05 MED ORDER — POTASSIUM CHLORIDE 10 MEQ/100ML IV SOLN
10.0000 meq | INTRAVENOUS | Status: AC
  Administered 2023-02-05 (×3): 10 meq via INTRAVENOUS
  Filled 2023-02-05 (×3): qty 100

## 2023-02-05 MED ORDER — ROCURONIUM BROMIDE 10 MG/ML (PF) SYRINGE
PREFILLED_SYRINGE | INTRAVENOUS | Status: AC
  Filled 2023-02-05: qty 10

## 2023-02-05 MED ORDER — ONDANSETRON HCL 4 MG/2ML IJ SOLN
4.0000 mg | Freq: Once | INTRAMUSCULAR | Status: AC
  Administered 2023-02-05: 4 mg via INTRAVENOUS
  Filled 2023-02-05: qty 2

## 2023-02-05 MED ORDER — DOCUSATE SODIUM 50 MG/5ML PO LIQD
100.0000 mg | Freq: Two times a day (BID) | ORAL | Status: DC
  Administered 2023-02-05 – 2023-02-07 (×4): 100 mg
  Filled 2023-02-05 (×4): qty 10

## 2023-02-05 MED ORDER — ENOXAPARIN SODIUM 40 MG/0.4ML IJ SOSY
40.0000 mg | PREFILLED_SYRINGE | INTRAMUSCULAR | Status: DC
  Administered 2023-02-06 – 2023-02-10 (×5): 40 mg via SUBCUTANEOUS
  Filled 2023-02-05 (×8): qty 0.4

## 2023-02-05 MED ORDER — PROPOFOL 1000 MG/100ML IV EMUL
INTRAVENOUS | Status: AC
  Administered 2023-02-05: 5 ug/kg/min via INTRAVENOUS
  Filled 2023-02-05: qty 100

## 2023-02-05 MED ORDER — PANTOPRAZOLE SODIUM 40 MG IV SOLR
40.0000 mg | Freq: Every day | INTRAVENOUS | Status: DC
  Administered 2023-02-05 – 2023-02-07 (×3): 40 mg via INTRAVENOUS
  Filled 2023-02-05 (×3): qty 10

## 2023-02-05 MED ORDER — BUPIVACAINE HCL (PF) 0.5 % IJ SOLN
INTRAMUSCULAR | Status: DC | PRN
  Administered 2023-02-05: 30 mL

## 2023-02-05 MED ORDER — LACTATED RINGERS IV SOLN: INTRAVENOUS | Status: DC | PRN

## 2023-02-05 MED ORDER — IPRATROPIUM-ALBUTEROL 0.5-2.5 (3) MG/3ML IN SOLN
RESPIRATORY_TRACT | Status: AC
  Administered 2023-02-05: 3 mL
  Filled 2023-02-05: qty 3

## 2023-02-05 MED ORDER — MIDAZOLAM HCL 2 MG/2ML IJ SOLN
INTRAMUSCULAR | Status: AC
  Filled 2023-02-05: qty 2

## 2023-02-05 MED ORDER — SUCCINYLCHOLINE CHLORIDE 200 MG/10ML IV SOSY
PREFILLED_SYRINGE | INTRAVENOUS | Status: AC
  Filled 2023-02-05: qty 10

## 2023-02-05 MED ORDER — MIDAZOLAM HCL 2 MG/2ML IJ SOLN: 1.0000 mg | INTRAMUSCULAR | Status: DC | PRN

## 2023-02-05 MED ORDER — FENTANYL CITRATE PF 50 MCG/ML IJ SOSY
50.0000 ug | PREFILLED_SYRINGE | Freq: Once | INTRAMUSCULAR | Status: AC
  Administered 2023-02-05: 50 ug via INTRAVENOUS
  Filled 2023-02-05: qty 1

## 2023-02-05 MED ORDER — ORAL CARE MOUTH RINSE: 15.0000 mL | OROMUCOSAL | Status: DC | PRN

## 2023-02-05 MED ORDER — CHLORHEXIDINE GLUCONATE CLOTH 2 % EX PADS
6.0000 | MEDICATED_PAD | Freq: Every day | CUTANEOUS | Status: DC
  Administered 2023-02-05 – 2023-02-08 (×4): 6 via TOPICAL

## 2023-02-05 MED ORDER — ORAL CARE MOUTH RINSE
15.0000 mL | OROMUCOSAL | Status: DC
  Administered 2023-02-05 – 2023-02-06 (×8): 15 mL via OROMUCOSAL

## 2023-02-05 MED ORDER — ACETAMINOPHEN 325 MG PO TABS
650.0000 mg | ORAL_TABLET | Freq: Four times a day (QID) | ORAL | Status: DC | PRN
  Administered 2023-02-05: 650 mg via ORAL
  Filled 2023-02-05: qty 2

## 2023-02-05 MED ORDER — HYDROMORPHONE HCL 1 MG/ML IJ SOLN
1.0000 mg | Freq: Once | INTRAMUSCULAR | Status: AC
  Administered 2023-02-05: 1 mg via INTRAVENOUS
  Filled 2023-02-05: qty 1

## 2023-02-05 MED ORDER — ACETAMINOPHEN 325 MG PO TABS: 650.0000 mg | ORAL_TABLET | Freq: Four times a day (QID) | ORAL | Status: DC | PRN

## 2023-02-05 MED ORDER — LACTATED RINGERS IV SOLN
INTRAVENOUS | Status: DC | PRN
Start: 2023-02-05 — End: 2023-02-05

## 2023-02-05 MED ORDER — IOHEXOL 300 MG/ML  SOLN
100.0000 mL | Freq: Once | INTRAMUSCULAR | Status: AC | PRN
  Administered 2023-02-05: 100 mL via INTRAVENOUS

## 2023-02-05 MED ORDER — BUPIVACAINE HCL (PF) 0.5 % IJ SOLN
INTRAMUSCULAR | Status: AC
  Filled 2023-02-05: qty 30

## 2023-02-05 MED ORDER — ALBUTEROL SULFATE (2.5 MG/3ML) 0.083% IN NEBU: 2.5000 mg | INHALATION_SOLUTION | RESPIRATORY_TRACT | Status: DC | PRN

## 2023-02-05 MED ORDER — PIPERACILLIN-TAZOBACTAM 3.375 G IVPB 30 MIN
3.3750 g | Freq: Once | INTRAVENOUS | Status: AC
  Administered 2023-02-05: 3.375 g via INTRAVENOUS
  Filled 2023-02-05: qty 50

## 2023-02-05 MED ORDER — POTASSIUM CHLORIDE 10 MEQ/100ML IV SOLN
10.0000 meq | Freq: Once | INTRAVENOUS | Status: AC
  Administered 2023-02-05: 10 meq via INTRAVENOUS
  Filled 2023-02-05: qty 100

## 2023-02-05 MED ORDER — LIDOCAINE HCL (PF) 2 % IJ SOLN
INTRAMUSCULAR | Status: AC
  Filled 2023-02-05: qty 5

## 2023-02-05 MED ORDER — POTASSIUM CHLORIDE 10 MEQ/100ML IV SOLN
10.0000 meq | INTRAVENOUS | Status: DC
  Administered 2023-02-05 (×2): 10 meq via INTRAVENOUS
  Filled 2023-02-05 (×3): qty 100

## 2023-02-05 MED ORDER — DEXAMETHASONE SODIUM PHOSPHATE 10 MG/ML IJ SOLN
INTRAMUSCULAR | Status: DC | PRN
  Administered 2023-02-05: 10 mg via INTRAVENOUS

## 2023-02-05 MED ORDER — SODIUM CHLORIDE 0.9 % IR SOLN
Status: DC | PRN
  Administered 2023-02-05 (×6): 1000 mL

## 2023-02-05 MED ORDER — ONDANSETRON HCL 4 MG/2ML IJ SOLN
INTRAMUSCULAR | Status: AC
  Filled 2023-02-05: qty 2

## 2023-02-05 MED ORDER — PROPOFOL 10 MG/ML IV BOLUS
INTRAVENOUS | Status: AC
  Filled 2023-02-05: qty 20

## 2023-02-05 MED ORDER — LACTATED RINGERS IV SOLN: INTRAVENOUS | Status: DC

## 2023-02-05 MED ORDER — ENOXAPARIN SODIUM 40 MG/0.4ML IJ SOSY
PREFILLED_SYRINGE | INTRAMUSCULAR | Status: AC
  Filled 2023-02-05: qty 0.4

## 2023-02-05 MED ORDER — FENTANYL CITRATE (PF) 250 MCG/5ML IJ SOLN
INTRAMUSCULAR | Status: AC
  Filled 2023-02-05: qty 5

## 2023-02-05 MED ORDER — POLYETHYLENE GLYCOL 3350 17 G PO PACK
17.0000 g | PACK | Freq: Every day | ORAL | Status: DC
  Administered 2023-02-05 – 2023-02-07 (×3): 17 g
  Filled 2023-02-05 (×2): qty 1

## 2023-02-05 MED ORDER — ONDANSETRON 4 MG PO TBDP: 4.0000 mg | ORAL_TABLET | Freq: Four times a day (QID) | ORAL | Status: DC | PRN

## 2023-02-05 MED ORDER — FENTANYL CITRATE (PF) 100 MCG/2ML IJ SOLN
100.0000 ug | Freq: Once | INTRAMUSCULAR | Status: AC
  Administered 2023-02-05: 100 ug via INTRAVENOUS
  Filled 2023-02-05: qty 2

## 2023-02-05 MED ORDER — LACTATED RINGERS IV BOLUS (SEPSIS)
1000.0000 mL | Freq: Once | INTRAVENOUS | Status: AC
  Administered 2023-02-05: 1000 mL via INTRAVENOUS

## 2023-02-05 MED ORDER — PIPERACILLIN-TAZOBACTAM 3.375 G IVPB
3.3750 g | Freq: Three times a day (TID) | INTRAVENOUS | Status: AC
  Administered 2023-02-05 – 2023-02-10 (×15): 3.375 g via INTRAVENOUS
  Filled 2023-02-05 (×15): qty 50

## 2023-02-05 MED ORDER — HYDROMORPHONE HCL 1 MG/ML IJ SOLN
INTRAMUSCULAR | Status: AC
  Filled 2023-02-05: qty 1

## 2023-02-05 MED ORDER — HYDROMORPHONE HCL 1 MG/ML IJ SOLN
INTRAMUSCULAR | Status: DC | PRN
  Administered 2023-02-05 (×2): .5 mg via INTRAVENOUS

## 2023-02-05 MED ORDER — FENTANYL CITRATE (PF) 100 MCG/2ML IJ SOLN
INTRAMUSCULAR | Status: AC
  Filled 2023-02-05: qty 2

## 2023-02-05 MED ORDER — DEXAMETHASONE SODIUM PHOSPHATE 10 MG/ML IJ SOLN
INTRAMUSCULAR | Status: AC
  Filled 2023-02-05: qty 1

## 2023-02-05 MED ORDER — PHENYLEPHRINE HCL-NACL 20-0.9 MG/250ML-% IV SOLN
INTRAVENOUS | Status: AC
  Filled 2023-02-05: qty 250

## 2023-02-05 MED ORDER — FENTANYL CITRATE (PF) 100 MCG/2ML IJ SOLN
INTRAMUSCULAR | Status: DC | PRN
  Administered 2023-02-05 (×7): 50 ug via INTRAVENOUS

## 2023-02-05 MED ORDER — POVIDONE-IODINE 10 % EX OINT
TOPICAL_OINTMENT | CUTANEOUS | Status: AC
  Filled 2023-02-05: qty 2

## 2023-02-05 MED ORDER — FENTANYL CITRATE PF 50 MCG/ML IJ SOSY
50.0000 ug | PREFILLED_SYRINGE | INTRAMUSCULAR | Status: DC | PRN
  Administered 2023-02-05: 150 ug via INTRAVENOUS
  Administered 2023-02-05: 100 ug via INTRAVENOUS
  Administered 2023-02-06: 50 ug via INTRAVENOUS
  Administered 2023-02-06: 100 ug via INTRAVENOUS
  Administered 2023-02-06: 50 ug via INTRAVENOUS
  Filled 2023-02-05 (×3): qty 1
  Filled 2023-02-05 (×3): qty 2

## 2023-02-05 MED ORDER — POVIDONE-IODINE 10 % OINT PACKET
TOPICAL_OINTMENT | CUTANEOUS | Status: DC | PRN
  Administered 2023-02-05: 1 via TOPICAL

## 2023-02-05 MED ORDER — ACETAMINOPHEN 650 MG RE SUPP: 650.0000 mg | Freq: Four times a day (QID) | RECTAL | Status: DC | PRN

## 2023-02-05 MED ORDER — SUCCINYLCHOLINE CHLORIDE 200 MG/10ML IV SOSY
PREFILLED_SYRINGE | INTRAVENOUS | Status: DC | PRN
  Administered 2023-02-05: 140 mg via INTRAVENOUS

## 2023-02-05 MED ORDER — ENOXAPARIN SODIUM 40 MG/0.4ML IJ SOSY
40.0000 mg | PREFILLED_SYRINGE | Freq: Once | INTRAMUSCULAR | Status: AC
  Administered 2023-02-05: 40 mg via SUBCUTANEOUS

## 2023-02-05 MED ORDER — ROCURONIUM BROMIDE 10 MG/ML (PF) SYRINGE
PREFILLED_SYRINGE | INTRAVENOUS | Status: DC | PRN
  Administered 2023-02-05: 70 mg via INTRAVENOUS
  Administered 2023-02-05: 30 mg via INTRAVENOUS

## 2023-02-05 MED ORDER — PROPOFOL 10 MG/ML IV BOLUS
INTRAVENOUS | Status: DC | PRN
Start: 2023-02-05 — End: 2023-02-05
  Administered 2023-02-05: 170 mg via INTRAVENOUS

## 2023-02-05 SURGICAL SUPPLY — 42 items
APL PRP STRL LF DISP 70% ISPRP (MISCELLANEOUS) ×2
CHLORAPREP W/TINT 26 (MISCELLANEOUS) ×2 IMPLANT
CLOTH BEACON ORANGE TIMEOUT ST (SAFETY) ×2 IMPLANT
COVER LIGHT HANDLE STERIS (MISCELLANEOUS) ×4 IMPLANT
DRAPE WARM FLUID 44X44 (DRAPES) ×2 IMPLANT
DRSG OPSITE POSTOP 4X10 (GAUZE/BANDAGES/DRESSINGS) IMPLANT
ELECT BLADE 6 FLAT ULTRCLN (ELECTRODE) IMPLANT
ELECT REM PT RETURN 9FT ADLT (ELECTROSURGICAL) ×2
ELECTRODE REM PT RTRN 9FT ADLT (ELECTROSURGICAL) ×2 IMPLANT
EVACUATOR DRAINAGE 10X20 100CC (DRAIN) IMPLANT
EVACUATOR SILICONE 100CC (DRAIN) ×2
GLOVE BIOGEL PI IND STRL 7.0 (GLOVE) ×4 IMPLANT
GLOVE BIOGEL PI IND STRL 7.5 (GLOVE) IMPLANT
GLOVE SURG SS PI 7.5 STRL IVOR (GLOVE) ×4 IMPLANT
GOWN STRL REUS W/TWL LRG LVL3 (GOWN DISPOSABLE) ×6 IMPLANT
HANDLE SUCTION POOLE (INSTRUMENTS) IMPLANT
INST SET MAJOR GENERAL (KITS) ×2 IMPLANT
KIT TURNOVER KIT A (KITS) ×2 IMPLANT
LIGASURE IMPACT 36 18CM CVD LR (INSTRUMENTS) ×2 IMPLANT
MANIFOLD NEPTUNE II (INSTRUMENTS) ×2 IMPLANT
NDL HYPO 18GX1.5 BLUNT FILL (NEEDLE) ×2 IMPLANT
NDL HYPO 21X1.5 SAFETY (NEEDLE) ×2 IMPLANT
NEEDLE HYPO 18GX1.5 BLUNT FILL (NEEDLE) ×2 IMPLANT
NEEDLE HYPO 21X1.5 SAFETY (NEEDLE) ×2 IMPLANT
NS IRRIG 1000ML POUR BTL (IV SOLUTION) ×4 IMPLANT
PACK ABDOMINAL MAJOR (CUSTOM PROCEDURE TRAY) ×2 IMPLANT
PAD ARMBOARD 7.5X6 YLW CONV (MISCELLANEOUS) ×2 IMPLANT
PENCIL SMOKE EVACUATOR (MISCELLANEOUS) ×2 IMPLANT
POSITIONER HEAD 8X9X4 ADT (SOFTGOODS) ×2 IMPLANT
RETRACTOR WND ALEXIS-O 25 LRG (MISCELLANEOUS) IMPLANT
RTRCTR WOUND ALEXIS O 25CM LRG (MISCELLANEOUS) ×2
SET BASIN LINEN APH (SET/KITS/TRAYS/PACK) ×2 IMPLANT
SPONGE DRAIN TRACH 4X4 STRL 2S (GAUZE/BANDAGES/DRESSINGS) IMPLANT
SPONGE T-LAP 18X18 ~~LOC~~+RFID (SPONGE) ×2 IMPLANT
STAPLER PROXIMATE 75MM BLUE (STAPLE) IMPLANT
STAPLER VISISTAT (STAPLE) ×2 IMPLANT
SUCTION POOLE HANDLE (INSTRUMENTS) ×2
SUT ETHILON 3 0 FSL (SUTURE) IMPLANT
SUT PDS AB 0 CTX 60 (SUTURE) IMPLANT
SUT SILK 3 0 SH CR/8 (SUTURE) IMPLANT
SWAB CULTURE ESWAB REG 1ML (MISCELLANEOUS) IMPLANT
SYR 30ML LL (SYRINGE) ×4 IMPLANT

## 2023-02-05 NOTE — Progress Notes (Signed)
Emptied 750 mls of dark amber urine from foley catheter prior to going to the OR.

## 2023-02-05 NOTE — ED Notes (Signed)
Patient transported to CT 

## 2023-02-05 NOTE — Anesthesia Postprocedure Evaluation (Signed)
Anesthesia Post Note  Patient: Mia Waters  Procedure(s) Performed: EXPLORATION LAPAROTOMY (Abdomen) APPENDECTOMY (Abdomen)  Patient location during evaluation: PACU Anesthesia Type: General Level of consciousness: patient remains intubated per anesthesia plan Pain management: pain level controlled Vital Signs Assessment: post-procedure vital signs reviewed and stable Respiratory status: patient remains intubated per anesthesia plan Cardiovascular status: blood pressure returned to baseline and stable Postop Assessment: no apparent nausea or vomiting Anesthetic complications: no   No notable events documented.   Last Vitals:  Vitals:   02/05/23 1400 02/05/23 1429  BP:    Pulse:    Resp:    Temp:  (!) 36.3 C  SpO2: 96%     Last Pain:  Vitals:   02/05/23 1429  TempSrc: Axillary  PainSc:                  Mia Waters C Rachit Grim

## 2023-02-05 NOTE — Transfer of Care (Signed)
Immediate Anesthesia Transfer of Care Note  Patient: Mia Waters  Procedure(s) Performed: EXPLORATION LAPAROTOMY (Abdomen) APPENDECTOMY (Abdomen)  Patient Location: PACU and ICU  Anesthesia Type:General  Level of Consciousness: sedated, drowsy, and Patient remains intubated per anesthesia plan  Airway & Oxygen Therapy: Patient Spontanous Breathing and Patient remains intubated per anesthesia plan  Post-op Assessment: Report given to RN and Post -op Vital signs reviewed and stable  Post vital signs: Reviewed and stable  Last Vitals:  Vitals Value Taken Time  BP 126/77 02/05/23 1423  Temp 97.3 02/05/23    1410  Pulse 110 02/05/23 1425  Resp 20 02/05/23 1425  SpO2 96 % 02/05/23 1425  Vitals shown include unfiled device data.  Last Pain:  Vitals:   02/05/23 1113  TempSrc: Oral  PainSc: 8          Complications: No notable events documented.

## 2023-02-05 NOTE — ED Notes (Signed)
XR at bedside

## 2023-02-05 NOTE — Anesthesia Preprocedure Evaluation (Addendum)
Anesthesia Evaluation  Patient identified by MRN, date of birth, ID band Patient awake    Reviewed: Allergy & Precautions, H&P , NPO status , Patient's Chart, lab work & pertinent test results  Airway Mallampati: II  TM Distance: >3 FB Neck ROM: Full    Dental  (+) Dental Advisory Given, Teeth Intact   Pulmonary asthma  IMPRESSION: 1. Low lung volumes with bibasilar areas of subsegmental atelectasis or scarring.     Electronically Signed   By: Trudie Reed M.D.   On: 02/05/2023 05:28     Room air saturations 92, Possible aspiration in ER  + rhonchi (on left side)  + wheezing (on left side)      Cardiovascular Exercise Tolerance: Good hypertension, Pt. on medications  Rhythm:Regular Rate:Tachycardia     Neuro/Psych  Headaches  Neuromuscular disease  negative psych ROS   GI/Hepatic Neg liver ROS, hiatal hernia,GERD  ,,  Endo/Other  negative endocrine ROS    Renal/GU Renal disease  negative genitourinary   Musculoskeletal  (+) Arthritis ,  Fibromyalgia -, narcotic dependent  Abdominal   Peds negative pediatric ROS (+)  Hematology  (+) Blood dyscrasia, anemia   Anesthesia Other Findings Room air saturations 92, Possible aspiration in ED  Reproductive/Obstetrics negative OB ROS                             Anesthesia Physical Anesthesia Plan  ASA: 3 and emergent  Anesthesia Plan: General   Post-op Pain Management: Dilaudid IV   Induction: Intravenous, Rapid sequence and Cricoid pressure planned  PONV Risk Score and Plan: 4 or greater and Dexamethasone  Airway Management Planned: Oral ETT  Additional Equipment:   Intra-op Plan:   Post-operative Plan: Extubation in OR and Possible Post-op intubation/ventilation  Informed Consent: I have reviewed the patients History and Physical, chart, labs and discussed the procedure including the risks, benefits and alternatives  for the proposed anesthesia with the patient or authorized representative who has indicated his/her understanding and acceptance.     Dental advisory given  Plan Discussed with: CRNA and Surgeon  Anesthesia Plan Comments:         Anesthesia Quick Evaluation

## 2023-02-05 NOTE — ED Triage Notes (Signed)
Pt arrives via [facility] from home following concerns for NV along with generalized abdominal pain ongoing since Friday. Reports emesis x3 in past 24 hours. Sts concern for blood in emesis. Pt also brought in daughter her for similar s/s

## 2023-02-05 NOTE — Addendum Note (Signed)
Addendum  created 02/05/23 1520 by Molli Barrows, MD   Intraprocedure Meds edited

## 2023-02-05 NOTE — Op Note (Signed)
Patient:  Mia Waters  DOB:  05-09-1981  MRN:  [id number]   Preop Diagnosis: Perforated viscus, peritonitis  Postop Diagnosis: Same, perforated appendicitis  Procedure: Exploratory laparotomy, appendectomy  Surgeon: Franky Macho, MD  Anes: General Endotracheal  Indications: Patient is a 42 year old white female who presented to the emergency room with a 2-day history of worsening abdominal pain.  CT scan of the abdomen revealed pneumoperitoneum with evidence of a perforated viscus down towards the terminal ileum.  The patient now comes the operating room for exploratory laparotomy.  The risks and benefits of the procedure including bleeding, infection, and the possibility of a bowel resection were fully explained to the patient, who gave informed consent.  Procedure note: The patient was placed in supine position.  After induction of general endotracheal anesthesia, the abdomen was prepped and draped using the usual sterile technique with ChloraPrep.  Surgical site confirmation was performed.  Midline incision was made from above the umbilicus to below the umbilicus.  The peritoneal cavity was then entered into without difficulty.  Upon entering the peritoneal cavity, purulent fluid was found consistent with diffuse peritonitis.  Aerobic and anaerobic cultures were taken and sent for microbiology.  A wound protector was then placed.  The proximal small bowel was noted to be significantly dilated.  While exploring the bowel from the ligament of Treitz to the terminal ileum, it was noted the patient had perforated at the base of the appendix with stool being present just outside the appendix.  Patient did have evidence of fecal peritonitis as well as diffuse peritonitis.  Multiple loculations were broken up.  I was able to inspect the terminal ileum and there was no evidence of Crohn's disease.  The appendix was mobilized and the mesoappendix was divided using the LigaSure.  I was able to come  across the appendiceal/cecal juncture with a GIA 75 stapler.  The appendix was then removed from the operative field.  The staple line was oversewn using 3-0 silk Lembert sutures.  The abdominal cavity was then copiously irrigated with normal saline until the effluent was clear.  I did also milk the intraluminal contents of the proximal small bowel back into the stomach.  There was 1 small serosal tear in the mid small bowel which was repaired using a 3-0 silk suture.  A #10 flat Jackson-Pratt drain was placed into the right lower quadrant and brought out through a separate stab wound in the right lower quadrant.  It was secured at the skin level using a 3-0 nylon interrupted suture.  The staple line was inspected and noted to be intact.  I again copiously irrigated the abdominal cavity with saline.  The bowel was returned into the abdominal cavity in an orderly fashion.  The fascia was reapproximated using a looped 0 PDS running suture.  The subcutaneous layer was copiously irrigated with normal saline.  0.5% Sensorcaine was instilled into the surrounding wound.  The skin was closed loosely using staples.  Betadine ointment and dry sterile dressing were applied.  All tape and needle counts were correct at the end of the procedure.  The patient was left intubated due to her history of asthma and transferred to the ICU in guarded but stable condition.  Complications: None  EBL: Minimal  Specimen: Appendix

## 2023-02-05 NOTE — ED Provider Notes (Signed)
[place] [facility] AT [facility] Provider Note   CSN: [id number] Arrival date & time: 02/05/23  3086     History  Chief Complaint  Patient presents with   Emesis   Level 5 caveat due to acuity of condition Mia Waters is a 42 y.o. female.  The history is provided by the patient.  Patient history of hypertension, fibromyalgia presents with abdominal pain. Patient reports over 2 days ago she started having diffuse abdominal pain with nausea and vomiting.  She reports no bowel movement for least 5 days.  She reports after multiple episodes of emesis she has had blood mixed in.  She reports diffuse abdominal pain that is rapidly worsening. Denies any urinary symptoms. Last menstrual cycle was last month She reports her daughter is also vomiting   Abdominal surgery includes umbilical herniorrhaphy Past Medical History:  Diagnosis Date   Arthritis    Coccyx    Asthma    "grew out of it'   Fibromyalgia    Gastritis    GERD (gastroesophageal reflux disease)    H/O hiatal hernia    Headache    History of migraine    Hypertension    Pyelonephritis    Sciatic pain, left    Vaginal Pap smear, abnormal    f/u ok    Home Medications Prior to Admission medications   Medication Sig Start Date End Date Taking? Authorizing Provider  buprenorphine (SUBUTEX) 2 MG SUBL SL tablet buprenorphine HCl 2 mg sublingual tablet  Place 1 tablet every day by sublingual route for 5 days.    [provider]  ferrous sulfate 325 (65 FE) MG tablet Take 1 tablet (325 mg total) by mouth every other day. 09/15/20   Constant, Peggy, MD  ferrous sulfate 325 (65 FE) MG tablet TAKE 1 TABLET (325 MG TOTAL) BY MOUTH EVERY OTHER DAY. 09/15/20 09/15/21  Constant, Peggy, MD  ibuprofen (ADVIL) 600 MG tablet Take 1 tablet (600 mg total) by mouth every 6 (six) hours. 09/15/20   Constant, Peggy, MD  lisinopril (ZESTRIL) 5 MG tablet Take 1 tablet (5 mg total) by mouth daily. 09/16/20    Adam Phenix, MD  lisinopril (ZESTRIL) 5 MG tablet TAKE 1 TABLET (5 MG TOTAL) BY MOUTH DAILY. 09/16/20 09/16/21  Adam Phenix, MD  NIFEdipine (ADALAT CC) 60 MG 24 hr tablet Take 1 tablet (60 mg total) by mouth 2 (two) times daily. 09/15/20   Constant, Peggy, MD  NIFEdipine (ADALAT CC) 60 MG 24 hr tablet TAKE 1 TABLET (60 MG TOTAL) BY MOUTH TWO TIMES DAILY. 09/15/20 09/15/21  Constant, Peggy, MD  Prenatal Vit-Fe Fumarate-FA (PRENATAL VITAMIN PO) Take by mouth.    [provider]      Allergies    Patient has no known allergies.    Review of Systems   Review of Systems  Unable to perform ROS: Acuity of condition  Gastrointestinal:  Positive for abdominal pain and vomiting.  Genitourinary:  Negative for dysuria.    Physical Exam Updated Vital Signs BP 111/68   Pulse 96   Temp 98.1 F (36.7 C) (Oral)   Resp 14   LMP  (LMP Unknown)   SpO2 93%  Physical Exam CONSTITUTIONAL: Ill-appearing HEAD: Normocephalic/atraumatic EYES: EOMI/PERRL, no icterus ENMT: Mucous membranes dry NECK: supple no meningeal signs CV: S1/S2 noted, tachycardic LUNGS: Lungs are clear to auscultation bilaterally, no apparent distress ABDOMEN: soft, distended, diffuse significant tenderness is noted, rebound is noted NEURO: Pt is awake/alert/appropriate, moves  all extremitiesx4.  No facial droop.   EXTREMITIES: pulses normal/equal, full ROM SKIN: warm, color normal PSYCH: Anxious ED Results / Procedures / Treatments   Labs (all labs ordered are listed, but only abnormal results are displayed) Labs Reviewed  COMPREHENSIVE METABOLIC PANEL - Abnormal; Notable for the following components:      Result Value   Sodium 130 (*)    Potassium 2.6 (*)    Chloride 88 (*)    Glucose, Bld 124 (*)    BUN 25 (*)    Calcium 8.4 (*)    Albumin 2.8 (*)    AST 52 (*)    All other components within normal limits  CBC - Abnormal; Notable for the following components:   WBC 24.9 (*)    HCT 35.9 (*)    All  other components within normal limits  URINALYSIS, ROUTINE W REFLEX MICROSCOPIC - Abnormal; Notable for the following components:   APPearance HAZY (*)    Hgb urine dipstick MODERATE (*)    Ketones, ur 5 (*)    Protein, ur 100 (*)    Bacteria, UA RARE (*)    All other components within normal limits  SARS CORONAVIRUS 2 BY RT PCR  CULTURE, BLOOD (ROUTINE X 2)  CULTURE, BLOOD (ROUTINE X 2)  LIPASE, BLOOD  HCG, SERUM, QUALITATIVE  MAGNESIUM  PROTIME-INR  APTT  LACTIC ACID, PLASMA  LACTIC ACID, PLASMA  POC URINE PREG, ED  TYPE AND SCREEN    EKG EKG Interpretation Date/Time:  Sunday February 05 2023 04:41:24 EDT Ventricular Rate:  99 PR Interval:  138 QRS Duration:  93 QT Interval:  372 QTC Calculation: 478 R Axis:   89  Text Interpretation: Sinus rhythm Low voltage, precordial leads Borderline repolarization abnormality No previous ECGs available Confirmed by Zadie Rhine ([id number]) on 02/05/2023 4:45:54 AM  Radiology CT ABDOMEN PELVIS W CONTRAST  Result Date: 02/05/2023 CLINICAL DATA:  42 year old female with history of nonlocalized abdominal pain. EXAM: CT ABDOMEN AND PELVIS WITH CONTRAST TECHNIQUE: Multidetector CT imaging of the abdomen and pelvis was performed using the standard protocol following bolus administration of intravenous contrast. RADIATION DOSE REDUCTION: This exam was performed according to the departmental dose-optimization program which includes automated exposure control, adjustment of the mA and/or kV according to patient size and/or use of iterative reconstruction technique. CONTRAST:  OMNIPAQUE IOHEXOL 300 MG/ML  SOLN COMPARISON:  CT of the abdomen and pelvis 08/23/2014. FINDINGS: Lower chest: Patchy areas of subsegmental atelectasis and/or scarring are noted in the lung bases bilaterally. Hepatobiliary: Amorphous area of low attenuation or hypoperfusion in the anterior aspect of segment 4B adjacent to the falciform ligament, strongly favored to represent  a benign perfusion anomaly and/or focal fatty infiltration. No other suspicious appearing hepatic lesions are noted. No intra or extrahepatic biliary ductal dilatation. Gallbladder is unremarkable in appearance. Pancreas: No pancreatic mass. No pancreatic ductal dilatation. No pancreatic or peripancreatic fluid collections or inflammatory changes. Spleen: Unremarkable. Adrenals/Urinary Tract: Bilateral kidneys and adrenal glands are normal in appearance. No hydroureteronephrosis. Urinary bladder is unremarkable in appearance. Stomach/Bowel: Stomach is moderately distended. Duodenum is decompressed. There are multiple borderline dilated and dilated loops of small bowel throughout the abdomen with multiple air-fluid levels. Small bowel loops measure up to 5.2 cm in diameter. Several segmental areas of mural thickening, mucosal hyperenhancement and surrounding inflammation are noted, most severe in the region of the distal and terminal ileum where there is associated luminal narrowing. Extensive surrounding inflammation is also noted in the region of  the distal and terminal ileum, as well as some extraluminal gas and fluid, indicative of perforation, best appreciated on axial image 75 of series 2 where there is extraluminal gas and fluid with an air-fluid level. Proximal appendix also appears dilated and inflamed measuring up to 1.5 cm in diameter, although the distal aspect of the appendix is normal in caliber. Vascular/Lymphatic: Mild atherosclerosis in the abdominal aorta and pelvic vasculature, without evidence of aneurysm or dissection. No lymphadenopathy noted in the abdomen or pelvis. Reproductive: Previously noted IUD has been removed. Uterus and ovaries are otherwise unremarkable in appearance. Other: Small volume of free fluid. Small amount of pneumoperitoneum centered in the right lower quadrant of the abdomen. Musculoskeletal: There are no aggressive appearing lytic or blastic lesions noted in the  visualized portions of the skeleton. IMPRESSION: 1. Study is positive for bowel perforation. Overall, the appearance is suggestive of inflammatory bowel disease such as Crohn's enteritis, as detailed above, with probable perforation in the region of the distal and terminal ileum, and multiple loops of dilated mid small bowel suggestive of associated bowel obstruction. Surgical consultation is strongly recommended. 2. The base of the appendix is also dilated and inflamed, however, this is favored to be part of the underlying inflammatory bowel disease rather indicative of acute appendicitis. 3. Aortic atherosclerosis. 4. Additional incidental findings, as above. Critical Value/emergent results were called by telephone at the time of interpretation on 02/05/2023 at 5:56 am to provider Zadie Rhine, who verbally acknowledged these results. Electronically Signed   By: Trudie Reed M.D.   On: 02/05/2023 05:59   DG Chest Portable 1 View  Result Date: 02/05/2023 CLINICAL DATA:  42 year old female with history of chest pain. Hemoptysis. EXAM: PORTABLE CHEST 1 VIEW COMPARISON:  No priors. FINDINGS: Lung volumes are low. There bibasilar opacities which are very linear in appearance, favored to reflect areas of subsegmental atelectasis or scarring. No definite consolidative airspace disease. No pleural effusions. No pneumothorax. No evidence of pulmonary edema. Heart size is normal. Upper mediastinal contours are within normal limits. IMPRESSION: 1. Low lung volumes with bibasilar areas of subsegmental atelectasis or scarring. Electronically Signed   By: Trudie Reed M.D.   On: 02/05/2023 05:28    Procedures .Critical Care  Performed by: Zadie Rhine, MD Authorized by: Zadie Rhine, MD   Critical care provider statement:    Critical care time (minutes):  76   Critical care start time:  02/05/2023 5:00 AM   Critical care end time:  02/05/2023 6:16 AM   Critical care time was exclusive of:   Separately billable procedures and treating other patients   Critical care was necessary to treat or prevent imminent or life-threatening deterioration of the following conditions:  Dehydration, sepsis and shock   Critical care was time spent personally by me on the following activities:  Examination of patient, discussions with primary provider, evaluation of patient's response to treatment, re-evaluation of patient's condition, pulse oximetry, ordering and review of radiographic studies, ordering and review of laboratory studies, review of old charts, ordering and performing treatments and interventions, development of treatment plan with patient or surrogate and obtaining history from patient or surrogate   I assumed direction of critical care for this patient from another provider in my specialty: no     Care discussed with: admitting provider       Medications Ordered in ED Medications  potassium chloride 10 mEq in 100 mL IVPB (has no administration in time range)  ondansetron (ZOFRAN) injection 4 mg (4  mg Intravenous Given 02/05/23 0413)  fentaNYL (SUBLIMAZE) injection 100 mcg (100 mcg Intravenous Given 02/05/23 0529)  ondansetron (ZOFRAN) injection 4 mg (4 mg Intravenous Given 02/05/23 0529)  lactated ringers bolus 1,000 mL (1,000 mLs Intravenous New Bag/Given 02/05/23 0529)  iohexol (OMNIPAQUE) 300 MG/ML solution 100 mL (100 mLs Intravenous Contrast Given 02/05/23 0505)  lactated ringers bolus 1,000 mL (1,000 mLs Intravenous New Bag/Given 02/05/23 0540)  piperacillin-tazobactam (ZOSYN) IVPB 3.375 g (0 g Intravenous Stopped 02/05/23 0608)    ED Course/ Medical Decision Making/ A&P Clinical Course as of 02/05/23 7829  [facility] Feb 05, 2023  0450 Potassium(!!): 2.6 Hypokalemia  [DW]  0451 Glucose(!): 124 Mild hyperglycemia [DW]  0451 WBC(!): 24.9 Significant leukocytosis [DW]  0521 Patient ill-appearing on my exam.  She has significant abdominal tenderness and distention.  She was tachycardic  and elevated white count.  I have ordered IV fluids and IV antibiotics [DW]  5621 Discussed with radiologist about her CT scans.  Patient has signs of pneumoperitoneum.  Also has signs of inflammatory bowel disease.  Will consult surgery [DW]  0600 Discussed with Dr. Franky Macho with general surgery.  He will see the patient [DW]  0607 Patient updated on plan.  Her vitals have normalized She denies any known history of inflammatory bowel disease she was told she may have IBS. She does not recall have any recent abdominal pain or bloody diarrhea prior to 2 days ago [DW]  0607 Other issues include the  patient has a young child with her without anybody able to care for the child.  She is attempting to call family members but unsuccessful at this time.  Patient's daughter is currently being managed by nursing staff [DW]    Clinical Course User Index [DW] Zadie Rhine, MD                                 Medical Decision Making Amount and/or Complexity of Data Reviewed Labs: ordered. Decision-making details documented in ED Course. Radiology: ordered. ECG/medicine tests: ordered.  Risk Prescription drug management. Decision regarding hospitalization.   This patient presents to the ED for concern of abdominal pain, this involves an extensive number of treatment options, and is a complaint that carries with it a high risk of complications and morbidity.  The differential diagnosis includes but is not limited to cholecystitis, cholelithiasis, pancreatitis, gastritis, peptic ulcer disease, appendicitis, bowel obstruction, bowel perforation, diverticulitis, ischemic bowel, ectopic pregnancy, TOA, PID    Comorbidities that complicate the patient evaluation: Patient's presentation is complicated by their history of hypertension  Social Determinants of Health: Patient's  social support   increases the complexity of managing their presentation  Additional history obtained: Records reviewed  previous admission documents  Lab Tests: I Ordered, and personally interpreted labs.  The pertinent results include: Leukocytosis, dehydration, hypokalemia  Imaging Studies ordered: I ordered imaging studies including CT scan abdomen pelvis and X-ray chest   I independently visualized and interpreted imaging which showed pneumoperitoneum from I agree with the radiologist interpretation  Cardiac Monitoring: The patient was maintained on a cardiac monitor.  I personally viewed and interpreted the cardiac monitor which showed an underlying rhythm of:  sinus tachycardia  Medicines ordered and prescription drug management: I ordered medication including IV fluids and IV antibiotics for pneumoperitoneum Fentanyl for pain Reevaluation of the patient after these medicines showed that the patient    improved   Critical Interventions:   IV  fluids and IV antibiotics  Consultations Obtained: I requested consultation with the consultant Dr. Lovell Sheehan , and discussed  findings as well as pertinent plan - they recommend: Was seen in the ER, likely operative management  Reevaluation: After the interventions noted above, I reevaluated the patient and found that they have :improved  Complexity of problems addressed: Patient's presentation is most consistent with  acute presentation with potential threat to life or bodily function  Disposition: After consideration of the diagnostic results and the patient's response to treatment,  I feel that the patent would benefit from admission   .           Final Clinical Impression(s) / ED Diagnoses Final diagnoses:  Pneumoperitoneum  Hypokalemia  Dehydration    Rx / DC Orders ED Discharge Orders     None         Zadie Rhine, MD 02/05/23 [phone]

## 2023-02-05 NOTE — Anesthesia Procedure Notes (Signed)
Procedure Name: Intubation Date/Time: 02/05/2023 12:22 PM  Performed by: Molli Barrows, MDPre-anesthesia Checklist: Patient identified, Emergency Drugs available, Suction available and Patient being monitored Patient Re-evaluated:Patient Re-evaluated prior to induction Oxygen Delivery Method: Circle system utilized Preoxygenation: Pre-oxygenation with 100% oxygen Induction Type: IV induction, Cricoid Pressure applied and Rapid sequence Laryngoscope Size: Glidescope and 3 Grade View: Grade I Tube type: Oral Tube size: 7.5 mm Number of attempts: 1 Airway Equipment and Method: Stylet and Oral airway Placement Confirmation: ETT inserted through vocal cords under direct vision, positive ETCO2 and breath sounds checked- equal and bilateral Secured at: 20 cm Tube secured with: Tape Dental Injury: Teeth and Oropharynx as per pre-operative assessment

## 2023-02-05 NOTE — ED Notes (Signed)
Attempted to call Clarisse Gouge again and no answer.

## 2023-02-05 NOTE — ED Notes (Signed)
Pt aunt states she will be here by 12:30pm to pick up pt child.

## 2023-02-05 NOTE — ED Notes (Signed)
Nurse called daughter of pt per pt request Mia Waters [phone]) to see if she is going to pick up the youngest child.

## 2023-02-05 NOTE — H&P (Signed)
Mia Waters is an 42 y.o. female.   Chief Complaint: Abdominal pain HPI: Patient is a 42 year old white female who presented to the emergency room with a 2-day history of worsening abdominal pain and distention.  She also had significant nausea and vomiting.  CT scan of the abdomen was performed which revealed pneumoperitoneum and intra-abdominal fluid collection consistent with perforated distal small bowel.  Patient denies a history of Crohn's disease.  She does have a history of irritable bowel disease but has not seen a GI doctor for many years.  She denies any diarrhea.  Past Medical History:  Diagnosis Date   Arthritis    Coccyx    Asthma    "grew out of it'   Fibromyalgia    Gastritis    GERD (gastroesophageal reflux disease)    H/O hiatal hernia    Headache    History of migraine    Hypertension    Pyelonephritis    Sciatic pain, left    Vaginal Pap smear, abnormal    f/u ok    Past Surgical History:  Procedure Laterality Date   DILATION AND CURETTAGE OF UTERUS     MOUTH SURGERY     UMBILICAL HERNIA REPAIR N/A 04/03/2014   Procedure: HERNIA REPAIR UMBILICAL ;  Surgeon: Marlane Hatcher, MD;  Location: [facility];  Service: General;  Laterality: N/A;    Family History  Problem Relation Age of Onset   Diabetes Mother    Hypertension Mother    Heart disease Mother        heart failure   Stroke Mother        "mini strokes"   Heart disease Father        congenital   Healthy Brother    Asthma Daughter    Healthy Daughter    Healthy Son    Healthy Brother    Healthy Son    Healthy Daughter    Healthy Daughter    Cancer Other    Heart disease Other        grandfather   Alzheimer's disease Other        grandmother   Colon cancer Neg Hx    Social History:  reports that she has been smoking cigarettes and e-cigarettes. She started smoking about 27 years ago. She has a 10 pack-year smoking history. She has never used smokeless tobacco. She reports that she does not  currently use alcohol after a past usage of about 1.0 standard drink of alcohol per week. She reports that she does not use drugs.  Allergies: No Known Allergies  (Not in a hospital admission)   Results for orders placed or performed during the hospital encounter of 02/05/23 (from the past 48 hour(s))  SARS Coronavirus 2 by RT PCR (hospital order, performed in [facility] hospital lab) *cepheid single result test* Anterior Nasal Swab     Status: None   Collection Time: 02/05/23  3:42 AM   Specimen: Anterior Nasal Swab  Result Value Ref Range   SARS Coronavirus 2 by RT PCR NEGATIVE NEGATIVE    Comment: (NOTE) SARS-CoV-2 target nucleic acids are NOT DETECTED.  The SARS-CoV-2 RNA is generally detectable in upper and lower respiratory specimens during the acute phase of infection. The lowest concentration of SARS-CoV-2 viral copies this assay can detect is 250 copies / mL. A negative result does not preclude SARS-CoV-2 infection and should not be used as the sole basis for treatment or other patient management decisions.  A negative  result may occur with improper specimen collection / handling, submission of specimen other than nasopharyngeal swab, presence of viral mutation(s) within the areas targeted by this assay, and inadequate number of viral copies (<250 copies / mL). A negative result must be combined with clinical observations, patient history, and epidemiological information.  Fact Sheet for Patients:   RoadLapTop.co.za  Fact Sheet for Healthcare Providers: http://kim-miller.com/  This test is not yet approved or  cleared by the [place] FDA and has been authorized for detection and/or diagnosis of SARS-CoV-2 by FDA under an Emergency Use Authorization (EUA).  This EUA will remain in effect (meaning this test can be used) for the duration of the COVID-19 declaration under Section 564(b)(1) of the Act, 21 U.S.C. section  360bbb-3(b)(1), unless the authorization is terminated or revoked sooner.  Performed at [facility], [address], [place], [place] [address]   Lipase, blood     Status: None   Collection Time: 02/05/23  4:00 AM  Result Value Ref Range   Lipase 18 11 - 51 U/L    Comment: Performed at [facility], [address] [place], [place] [address]  Comprehensive metabolic panel     Status: Abnormal   Collection Time: 02/05/23  4:00 AM  Result Value Ref Range   Sodium 130 (L) 135 - 145 mmol/L   Potassium 2.6 (LL) 3.5 - 5.1 mmol/L    Comment: CRITICAL RESULT CALLED TO, READ BACK BY AND VERIFIED WITH MOSTELLER,M @ 0431 ON 02/05/23 BY JUW   Chloride 88 (L) 98 - 111 mmol/L   CO2 29 22 - 32 mmol/L   Glucose, Bld 124 (H) 70 - 99 mg/dL    Comment: Glucose reference range applies only to samples taken after fasting for at least 8 hours.   BUN 25 (H) 6 - 20 mg/dL   Creatinine, Ser 1.66 0.44 - 1.00 mg/dL   Calcium 8.4 (L) 8.9 - 10.3 mg/dL   Total Protein 6.6 6.5 - 8.1 g/dL   Albumin 2.8 (L) 3.5 - 5.0 g/dL   AST 52 (H) 15 - 41 U/L   ALT 21 0 - 44 U/L   Alkaline Phosphatase 50 38 - 126 U/L   Total Bilirubin 0.7 0.3 - 1.2 mg/dL   GFR, Estimated >06 >30 mL/min    Comment: (NOTE) Calculated using the CKD-EPI Creatinine Equation (2021)    Anion gap 13 5 - 15    Comment: Performed at [facility], [address], [place], [place] [address]  CBC     Status: Abnormal   Collection Time: 02/05/23  4:00 AM  Result Value Ref Range   WBC 24.9 (H) 4.0 - 10.5 K/uL   RBC 4.21 3.87 - 5.11 MIL/uL   Hemoglobin 12.4 12.0 - 15.0 g/dL   HCT 93.2 (L) 35.5 - 73.2 %   MCV 85.3 80.0 - 100.0 fL   MCH 29.5 26.0 - 34.0 pg   MCHC 34.5 30.0 - 36.0 g/dL   RDW 20.2 54.2 - 70.6 %   Platelets 397 150 - 400 K/uL   nRBC 0.0 0.0 - 0.2 %    Comment: Performed at [facility], [address], [place], [place] [address]  hCG, serum, qualitative     Status: None   Collection Time: 02/05/23  4:23 AM  Result Value Ref Range    Preg, Serum NEGATIVE NEGATIVE    Comment:        THE SENSITIVITY OF THIS METHODOLOGY IS >10 mIU/mL. Performed at [facility], [phone]  [address], [place], [place] [address]   Urinalysis, Routine w reflex microscopic -Urine, Clean Catch     Status: Abnormal   Collection Time: 02/05/23  4:57 AM  Result Value Ref Range   Color, Urine YELLOW YELLOW   APPearance HAZY (A) CLEAR   Specific Gravity, Urine 1.026 1.005 - 1.030   pH 6.0 5.0 - 8.0   Glucose, UA NEGATIVE NEGATIVE mg/dL   Hgb urine dipstick MODERATE (A) NEGATIVE   Bilirubin Urine NEGATIVE NEGATIVE   Ketones, ur 5 (A) NEGATIVE mg/dL   Protein, ur 478 (A) NEGATIVE mg/dL   Nitrite NEGATIVE NEGATIVE   Leukocytes,Ua NEGATIVE NEGATIVE   RBC / HPF 0-5 0 - 5 RBC/hpf   WBC, UA 0-5 0 - 5 WBC/hpf   Bacteria, UA RARE (A) NONE SEEN   Squamous Epithelial / HPF 0-5 0 - 5 /HPF   Mucus PRESENT    Hyaline Casts, UA PRESENT     Comment: Performed at [facility], [address] [place], [place] [address]  POC urine preg, ED     Status: None   Collection Time: 02/05/23  4:58 AM  Result Value Ref Range   Preg Test, Ur NEGATIVE NEGATIVE    Comment:        THE SENSITIVITY OF THIS METHODOLOGY IS >24 mIU/mL   Magnesium     Status: None   Collection Time: 02/05/23  6:15 AM  Result Value Ref Range   Magnesium 2.1 1.7 - 2.4 mg/dL    Comment: Performed at [facility], [address] [place], [place] [address]  Type and screen [facility]     Status: None   Collection Time: 02/05/23  6:15 AM  Result Value Ref Range   ABO/RH(D) A POS    Antibody Screen NEG    Sample Expiration      02/08/2023,2359 Performed at [facility], [address] [place], [place] [address]   Protime-INR     Status: Abnormal   Collection Time: 02/05/23  6:15 AM  Result Value Ref Range   Prothrombin Time 15.9 (H) 11.4 - 15.2 seconds   INR 1.2 0.8 - 1.2    Comment: (NOTE) INR goal varies based on device and disease states. Performed at [facility], [address] [place], [place] [address]   APTT     Status: None   Collection Time: 02/05/23  6:15 AM  Result Value Ref Range   aPTT 26 24 - 36 seconds    Comment: Performed at [facility], [address], [place], [place] [address]  Lactic acid, plasma     Status: None   Collection Time: 02/05/23  6:15 AM  Result Value Ref Range   Lactic Acid, Venous 0.9 0.5 - 1.9 mmol/L    Comment: Performed at [facility], [address] [place], [place] [address]   CT ABDOMEN PELVIS W CONTRAST  Result Date: 02/05/2023 CLINICAL DATA:  42 year old female with history of nonlocalized abdominal pain. EXAM: CT ABDOMEN AND PELVIS WITH CONTRAST TECHNIQUE: Multidetector CT imaging of the abdomen and pelvis was performed using the standard protocol following bolus administration of intravenous contrast. RADIATION DOSE REDUCTION: This exam was performed according to the departmental dose-optimization program which includes automated exposure control, adjustment of the mA and/or kV according to patient size and/or use of iterative reconstruction technique. CONTRAST:  OMNIPAQUE IOHEXOL 300 MG/ML  SOLN COMPARISON:  CT of the abdomen and pelvis 08/23/2014. FINDINGS: Lower chest: Patchy areas of subsegmental atelectasis and/or scarring are noted  in the lung bases bilaterally. Hepatobiliary: Amorphous area of low attenuation or hypoperfusion in the anterior aspect of segment 4B adjacent to the falciform ligament, strongly favored to represent a benign perfusion anomaly and/or focal fatty infiltration. No other suspicious appearing hepatic lesions are noted. No intra or extrahepatic biliary ductal dilatation. Gallbladder is unremarkable in appearance. Pancreas: No pancreatic mass. No pancreatic ductal dilatation. No pancreatic or peripancreatic fluid collections or inflammatory changes. Spleen: Unremarkable. Adrenals/Urinary Tract: Bilateral kidneys and adrenal glands are normal in appearance. No hydroureteronephrosis. Urinary  bladder is unremarkable in appearance. Stomach/Bowel: Stomach is moderately distended. Duodenum is decompressed. There are multiple borderline dilated and dilated loops of small bowel throughout the abdomen with multiple air-fluid levels. Small bowel loops measure up to 5.2 cm in diameter. Several segmental areas of mural thickening, mucosal hyperenhancement and surrounding inflammation are noted, most severe in the region of the distal and terminal ileum where there is associated luminal narrowing. Extensive surrounding inflammation is also noted in the region of the distal and terminal ileum, as well as some extraluminal gas and fluid, indicative of perforation, best appreciated on axial image 75 of series 2 where there is extraluminal gas and fluid with an air-fluid level. Proximal appendix also appears dilated and inflamed measuring up to 1.5 cm in diameter, although the distal aspect of the appendix is normal in caliber. Vascular/Lymphatic: Mild atherosclerosis in the abdominal aorta and pelvic vasculature, without evidence of aneurysm or dissection. No lymphadenopathy noted in the abdomen or pelvis. Reproductive: Previously noted IUD has been removed. Uterus and ovaries are otherwise unremarkable in appearance. Other: Small volume of free fluid. Small amount of pneumoperitoneum centered in the right lower quadrant of the abdomen. Musculoskeletal: There are no aggressive appearing lytic or blastic lesions noted in the visualized portions of the skeleton. IMPRESSION: 1. Study is positive for bowel perforation. Overall, the appearance is suggestive of inflammatory bowel disease such as Crohn's enteritis, as detailed above, with probable perforation in the region of the distal and terminal ileum, and multiple loops of dilated mid small bowel suggestive of associated bowel obstruction. Surgical consultation is strongly recommended. 2. The base of the appendix is also dilated and inflamed, however, this is favored  to be part of the underlying inflammatory bowel disease rather indicative of acute appendicitis. 3. Aortic atherosclerosis. 4. Additional incidental findings, as above. Critical Value/emergent results were called by telephone at the time of interpretation on 02/05/2023 at 5:56 am to provider Zadie Rhine, who verbally acknowledged these results. Electronically Signed   By: Trudie Reed M.D.   On: 02/05/2023 05:59   DG Chest Portable 1 View  Result Date: 02/05/2023 CLINICAL DATA:  42 year old female with history of chest pain. Hemoptysis. EXAM: PORTABLE CHEST 1 VIEW COMPARISON:  No priors. FINDINGS: Lung volumes are low. There bibasilar opacities which are very linear in appearance, favored to reflect areas of subsegmental atelectasis or scarring. No definite consolidative airspace disease. No pleural effusions. No pneumothorax. No evidence of pulmonary edema. Heart size is normal. Upper mediastinal contours are within normal limits. IMPRESSION: 1. Low lung volumes with bibasilar areas of subsegmental atelectasis or scarring. Electronically Signed   By: Trudie Reed M.D.   On: 02/05/2023 05:28    Review of Systems  Constitutional:  Positive for fatigue.  HENT: Negative.    Eyes: Negative.   Respiratory: Negative.    Cardiovascular: Negative.   Gastrointestinal:  Positive for abdominal distention, abdominal pain, nausea and vomiting.  Genitourinary: Negative.   Musculoskeletal:  Positive for  myalgias.  Skin: Negative.   Allergic/Immunologic: Negative.   Neurological: Negative.   Hematological: Negative.   Psychiatric/Behavioral: Negative.      Blood pressure 102/69, pulse 95, temperature 98.1 F (36.7 C), temperature source Oral, resp. rate 19, SpO2 96%, unknown if currently breastfeeding. Physical Exam Vitals reviewed.  Constitutional:      Appearance: She is obese. She is ill-appearing.  HENT:     Head: Normocephalic and atraumatic.  Cardiovascular:     Rate and Rhythm:  Normal rate and regular rhythm.     Heart sounds: Normal heart sounds. No murmur heard.    No friction rub. No gallop.  Pulmonary:     Effort: Pulmonary effort is normal. No respiratory distress.     Breath sounds: Normal breath sounds. No stridor. No wheezing, rhonchi or rales.  Abdominal:     General: There is distension.     Palpations: There is no mass.     Tenderness: There is abdominal tenderness. There is guarding and rebound.     Hernia: No hernia is present.     Comments: Diffusely tender with localized peritoneal signs.  Skin:    General: Skin is warm and dry.  Neurological:     Mental Status: She is alert and oriented to person, place, and time.     CT scanning images personally reviewed Assessment/Plan Impression: Perforated bowel with peritonitis Plan: Patient will be taken to the operating room for an exploratory laparotomy, possible bowel resection, possible ostomy.  The risks and benefits of the procedure including bleeding, infection, and the possible need for follow-up surgery were fully explained to the patient, who gave informed consent.  Franky Macho, MD 02/05/2023, 7:31 AM

## 2023-02-05 NOTE — Progress Notes (Signed)
Advanced ETT 3cm to 23cm at the lip. Patient tolerated ok.

## 2023-02-05 NOTE — ED Notes (Signed)
Charge Nurse rolled pt over to PACU to meet up with surgical team. Pt daughter is in fast track at the moment waiting for ride to come get her

## 2023-02-05 NOTE — Progress Notes (Signed)
Attempted to contact by phone patient mother (June) and patient daughter Clarisse Gouge), which are both on contact information, with no success. Neither contact answered their phone.

## 2023-02-05 NOTE — Progress Notes (Signed)
Urine output via foley catheter since arriving to unit this afternoon has only been . Notified Dr Lovell Sheehan. Foley catheter changed out with new foley per verbal orders from Dr Lovell Sheehan. 50ml of yellow urine output noted. Dr Lovell Sheehan made aware. Bolus ordered.

## 2023-02-05 NOTE — ED Notes (Signed)
Attempted to call daughter Clarisse Gouge to pick up her sister for her mother b/c pt has been unsuccessful in find her daughter a ride.

## 2023-02-05 NOTE — Progress Notes (Signed)
Patient transported to ICU #3. Staff present and waiting; resumed care of the patient. Xray tech to room for follow up xray.

## 2023-02-05 NOTE — Progress Notes (Signed)
ICU called to alert them of this admission.  Patient will be admitted to bed #3.  Also called ER to have them place the foley insertion time from her ER admission.  Called AC to bring ICU bed over for this patient.

## 2023-02-05 NOTE — Interval H&P Note (Signed)
History and Physical Interval Note:  02/05/2023 11:23 AM  Mia Waters  has presented today for surgery, with the diagnosis of perforated viscus.  The various methods of treatment have been discussed with the patient and family. After consideration of risks, benefits and other options for treatment, the patient has consented to  Procedure(s): EXPLORATION LAPAROTOMY (N/A) as a surgical intervention.  The patient's history has been reviewed, patient examined, no change in status, stable for surgery.  I have reviewed the patient's chart and labs.  Questions were answered to the patient's satisfaction.     Franky Macho

## 2023-02-06 ENCOUNTER — Inpatient Hospital Stay (HOSPITAL_COMMUNITY): Payer: Medicaid Other

## 2023-02-06 LAB — GLUCOSE, CAPILLARY
Glucose-Capillary: 104 mg/dL — ABNORMAL HIGH (ref 70–99)
Glucose-Capillary: 111 mg/dL — ABNORMAL HIGH (ref 70–99)
Glucose-Capillary: 117 mg/dL — ABNORMAL HIGH (ref 70–99)
Glucose-Capillary: 120 mg/dL — ABNORMAL HIGH (ref 70–99)
Glucose-Capillary: 131 mg/dL — ABNORMAL HIGH (ref 70–99)
Glucose-Capillary: 145 mg/dL — ABNORMAL HIGH (ref 70–99)

## 2023-02-06 MED ORDER — NICOTINE 21 MG/24HR TD PT24
21.0000 mg | MEDICATED_PATCH | Freq: Every day | TRANSDERMAL | Status: DC
  Administered 2023-02-06 – 2023-02-13 (×8): 21 mg via TRANSDERMAL
  Filled 2023-02-06 (×8): qty 1

## 2023-02-06 MED ORDER — POTASSIUM & SODIUM PHOSPHATES 280-160-250 MG PO PACK
1.0000 | PACK | Freq: Three times a day (TID) | ORAL | Status: DC
  Administered 2023-02-06 – 2023-02-07 (×4): 1
  Filled 2023-02-06 (×10): qty 1

## 2023-02-06 MED ORDER — LORAZEPAM 2 MG/ML IJ SOLN
1.0000 mg | INTRAMUSCULAR | Status: DC | PRN
  Administered 2023-02-06 – 2023-02-08 (×7): 1 mg via INTRAVENOUS
  Filled 2023-02-06 (×7): qty 1

## 2023-02-06 MED ORDER — METHOCARBAMOL 1000 MG/10ML IJ SOLN
500.0000 mg | Freq: Two times a day (BID) | INTRAVENOUS | Status: AC
  Administered 2023-02-06 – 2023-02-08 (×6): 500 mg via INTRAVENOUS
  Filled 2023-02-06: qty 500
  Filled 2023-02-06 (×4): qty 5
  Filled 2023-02-06: qty 500

## 2023-02-06 MED ORDER — ZOLPIDEM TARTRATE 5 MG PO TABS
5.0000 mg | ORAL_TABLET | Freq: Every evening | ORAL | Status: DC | PRN
  Administered 2023-02-06: 5 mg
  Filled 2023-02-06: qty 1

## 2023-02-06 MED ORDER — MUPIROCIN 2 % EX OINT
TOPICAL_OINTMENT | Freq: Two times a day (BID) | CUTANEOUS | Status: DC
  Administered 2023-02-08 – 2023-02-11 (×2): 1 via NASAL
  Filled 2023-02-06 (×3): qty 22

## 2023-02-06 MED ORDER — HYDROMORPHONE HCL 1 MG/ML IJ SOLN
1.0000 mg | INTRAMUSCULAR | Status: DC | PRN
  Administered 2023-02-06 – 2023-02-09 (×31): 1 mg via INTRAVENOUS
  Administered 2023-02-09: 0.5 mg via INTRAVENOUS
  Administered 2023-02-09 – 2023-02-11 (×16): 1 mg via INTRAVENOUS
  Filled 2023-02-06 (×49): qty 1

## 2023-02-06 MED ORDER — MENTHOL 3 MG MT LOZG
1.0000 | LOZENGE | OROMUCOSAL | Status: DC | PRN
  Administered 2023-02-06: 3 mg via ORAL
  Filled 2023-02-06: qty 9

## 2023-02-06 MED ORDER — HYDROMORPHONE HCL 1 MG/ML IJ SOLN
1.0000 mg | INTRAMUSCULAR | Status: DC | PRN
  Administered 2023-02-06: 1 mg via INTRAVENOUS
  Filled 2023-02-06: qty 1

## 2023-02-06 MED ORDER — GABAPENTIN 250 MG/5ML PO SOLN
100.0000 mg | Freq: Three times a day (TID) | ORAL | Status: DC
  Administered 2023-02-06 – 2023-02-07 (×4): 100 mg
  Filled 2023-02-06 (×7): qty 2

## 2023-02-06 MED ORDER — POTASSIUM CHLORIDE 10 MEQ/100ML IV SOLN
10.0000 meq | INTRAVENOUS | Status: AC
  Administered 2023-02-06 (×5): 10 meq via INTRAVENOUS
  Filled 2023-02-06 (×5): qty 100

## 2023-02-06 NOTE — Plan of Care (Signed)
°  Problem: Clinical Measurements: Goal: Respiratory complications will improve Outcome: Progressing Goal: Cardiovascular complication will be avoided Outcome: Progressing   Problem: Coping: Goal: Level of anxiety will decrease Outcome: Progressing   Problem: Elimination: Goal: Will not experience complications related to urinary retention Outcome: Progressing   Problem: Pain Managment: Goal: General experience of comfort will improve Outcome: Progressing   Problem: Safety: Goal: Ability to remain free from injury will improve Outcome: Progressing   Problem: Skin Integrity: Goal: Risk for impaired skin integrity will decrease Outcome: Progressing

## 2023-02-06 NOTE — Progress Notes (Signed)
Patient requested phone for room. Room phone provided to patient.

## 2023-02-06 NOTE — Progress Notes (Signed)
RT called to room due to patient self extubating.  RT arrived and patient on 100% NRB.  Patient is waking up and answering questions.  No shortness of breath noted.  Patient removed from NRB and placed on nasal cannula and tolerating at this time. RN made MD aware.

## 2023-02-06 NOTE — Progress Notes (Signed)
Patient self extubated at 0240 despite being on 12mcg/kg of propofol, giving PRN fentanyl, and having safety mitts on. When this RN came into the room after hearing the ventilator alarm. Patient had ETT in hand along with NG tube. Propofol was stopped, patient was suctioned, instructed to cough, and nonrebreather was applied. Respiratory was called. Patient began to wake up more and after respiratory arrival was placed on 2L [place]. Patient is alert and oriented times 4 complaining of pain in her back and abdominal region. Patient was repositioned. Dr.Jenkins was called and notified. Verbal order to place new NGT if able. All vitals within normal range.

## 2023-02-06 NOTE — Progress Notes (Signed)
Upon assessment of patient this am, Patient in 7/10 pain and requesting diet order. Last pain med give 0630, enable to give any at this time. Pt updated. Will reach out to MD regarding uncontrolled pain and diet clarification.

## 2023-02-06 NOTE — Evaluation (Signed)
Physical Therapy Evaluation Patient Details Name: Mia Waters MRN: [id number] DOB: 05-19-1981 Today's Date: 02/06/2023  History of Present Illness  Since surgery yesterday, patient self extubated and pulled out NG tube. NG tube was replaced.    Patient seen and examined. She notes throbbing abdominal pain in her right lower quadrant and says that her pain last night was worse than it was prior to surgery. She endorses some nausea but has not vomited. she denies any shortness of breath. She has not had a bowel movement and is concerned with her pain could be related to an obstruction given she has IBS-C. She normally has bowel movements every five days. Discussed heavily that this is likely an ileus with patient.   Clinical Impression  Pt admitted with above diagnosis. Patient POD #1 s/o exp lap/appendectomy. Patient premedicated prior to PT evaluation. Patient requires HOB elevated, use of bed rail and min/mod assistance for supine to sit. Patient required min assist for line management but otherwise contact guard assist for safety for transfers and short distance ambulation bed to chair. Overall, patient limited by pain and on 2 LPM O2 throughout session. Patient agreeable to sitting in recliner at end of session - nursing notified. Pt currently with functional limitations due to the deficits listed below (see PT Problem List). Pt will benefit from acute skilled PT to increase their independence and safety with mobility to allow discharge.           If plan is discharge home, recommend the following: A little help with walking and/or transfers;A little help with bathing/dressing/bathroom;Help with stairs or ramp for entrance;Assist for transportation;Assistance with cooking/housework   Can travel by private vehicle        Equipment Recommendations None recommended by PT (at this time. Will assess patient's progress with mobility prior to DC and recommend equipment at that time.)   Recommendations for Other Services       Functional Status Assessment Patient has had a recent decline in their functional status and demonstrates the ability to make significant improvements in function in a reasonable and predictable amount of time.     Precautions / Restrictions Precautions Precautions: Fall Precaution Comments: patient reports no falls in the last six months Restrictions Weight Bearing Restrictions: No      Mobility  Bed Mobility Overal bed mobility: Needs Assistance Bed Mobility: Supine to Sit     Supine to sit: Min assist, HOB elevated, Used rails     General bed mobility comments: slow, labored movements    Transfers Overall transfer level: Needs assistance Equipment used: Rolling walker (2 wheels) Transfers: Sit to/from Stand, Bed to chair/wheelchair/BSC Sit to Stand: Min assist, Contact guard assist   Step pivot transfers: Contact guard assist   Anterior-Posterior transfers: Supervision   General transfer comment: slow, labored movements    Ambulation/Gait Ambulation/Gait assistance: Contact guard assist Gait Distance (Feet): 4 Feet Assistive device: Rolling walker (2 wheels) Gait Pattern/deviations: Step-to pattern, Decreased step length - right, Decreased step length - left, Decreased stride length, Shuffle Gait velocity: decreased     General Gait Details: slow, labored cadence with RW using short shuffling steps from bed to chair; limited by pain; on 2LMP O2 throughout  Stairs            Wheelchair Mobility     Tilt Bed    Modified Rankin (Stroke Patients Only)       Balance Overall balance assessment: Needs assistance Sitting-balance support: Bilateral upper extremity supported, Feet supported Sitting  balance-Leahy Scale: Fair Sitting balance - Comments: seated at EOB   Standing balance support: Bilateral upper extremity supported, During functional activity, Reliant on assistive device for balance Standing  balance-Leahy Scale: Fair Standing balance comment: using RW         Pertinent Vitals/Pain Pain Assessment Pain Assessment: 0-10 Pain Score: 7  Pain Location: abdomen post surgical Pain Descriptors / Indicators: Nagging, Stabbing, Shooting Pain Intervention(s): Limited activity within patient's tolerance, Monitored during session, Premedicated before session, Repositioned    Home Living Family/patient expects to be discharged to:: Private residence Living Arrangements: Children Available Help at Discharge: Family;Available 24 hours/day Type of Home: Mobile home Home Access: Stairs to enter Entrance Stairs-Rails: Right;Left;Can reach both Entrance Stairs-Number of Steps: 4   Home Layout: One level Home Equipment: Crutches;Hand held shower head      Prior Function Prior Level of Function : Independent/Modified Independent;Driving             Mobility Comments: ambulated community distances without an assistive device; infrequently would use bilateral crutches due to low back pain       Extremity/Trunk Assessment   Upper Extremity Assessment Upper Extremity Assessment: Overall WFL for tasks assessed    Lower Extremity Assessment Lower Extremity Assessment: Overall WFL for tasks assessed    Cervical / Trunk Assessment Cervical / Trunk Assessment: Normal  Communication   Communication Communication: No apparent difficulties  Cognition Arousal: Alert Behavior During Therapy: WFL for tasks assessed/performed Overall Cognitive Status: Within Functional Limits for tasks assessed          General Comments      Exercises     Assessment/Plan    PT Assessment Patient needs continued PT services  PT Problem List Decreased knowledge of use of DME;Decreased activity tolerance;Decreased balance;Decreased mobility;Pain       PT Treatment Interventions DME instruction;Gait training;Stair training;Patient/family education;Functional mobility training;Therapeutic  activities;Therapeutic exercise;Balance training    PT Goals (Current goals can be found in the Care Plan section)  Acute Rehab PT Goals Patient Stated Goal: Go home with help from children. PT Goal Formulation: With patient Time For Goal Achievement: 02/20/23 Potential to Achieve Goals: Good    Frequency Min 3X/week        AM-PAC PT "6 Clicks" Mobility  Outcome Measure Help needed turning from your back to your side while in a flat bed without using bedrails?: A Little Help needed moving from lying on your back to sitting on the side of a flat bed without using bedrails?: A Lot Help needed moving to and from a bed to a chair (including a wheelchair)?: A Little Help needed standing up from a chair using your arms (e.g., wheelchair or bedside chair)?: A Lot Help needed to walk in hospital room?: A Lot Help needed climbing 3-5 steps with a railing? : A Lot 6 Click Score: 14    End of Session Equipment Utilized During Treatment: Oxygen Activity Tolerance: Patient limited by pain;Patient tolerated treatment well Patient left: in chair;with call bell/phone within reach Nurse Communication: Mobility status PT Visit Diagnosis: Unsteadiness on feet (R26.81);Other abnormalities of gait and mobility (R26.89)    Time: 4098-1191 PT Time Calculation (min) (ACUTE ONLY): 31 min   Charges:   PT Evaluation $PT Eval Low Complexity: 1 Low PT Treatments $Therapeutic Activity: 8-22 mins PT General Charges $$ ACUTE PT VISIT: 1 Visit         Katina Dung. Hartnett-Rands, MS, PT [facility]  [phone]  Britta Mccreedy  Hartnett-Rands 02/06/2023,  1:01 PM

## 2023-02-06 NOTE — Progress Notes (Signed)
   02/06/23 1117  TOC Brief Assessment  Insurance and Status Reviewed  Patient has primary care physician Yes  Home environment has been reviewed from home  Prior level of function: independent  Prior/Current Home Services No current home services  Social Determinants of Health Reivew SDOH reviewed no interventions necessary  Readmission risk has been reviewed Yes  Transition of care needs no transition of care needs at this time    Transition of Care Department [facility]) has reviewed patient and no TOC needs have been identified at this time. We will continue to monitor patient advancement through interdisciplinary progression rounds. If new patient transition needs arise, please place a TOC consult.

## 2023-02-06 NOTE — Plan of Care (Signed)

## 2023-02-06 NOTE — Progress Notes (Addendum)
1 Day Post-Op  Subjective: Since surgery yesterday, patient self extubated and pulled out NG tube. NG tube was replaced.  Patient seen and examined. She notes throbbing abdominal pain in her right lower quadrant and says that her pain last night was worse than it was prior to surgery. She endorses some nausea but has not vomited. she denies any shortness of breath. She has not had a bowel movement and is concerned with her pain could be related to an obstruction given she has IBS-C. She normally has bowel movements every five days. Discussed heavily that this is likely an ileus with patient.  Objective: Vital signs in last 24 hours: Temp:  [97.3 F (36.3 C)-98.7 F (37.1 C)] 98.7 F (37.1 C) (08/11 1600) Pulse Rate:  [90-116] 90 (08/12 0500) Resp:  [16-34] 20 (08/12 0500) BP: (102-134)/(57-84) 129/76 (08/12 0500) SpO2:  [90 %-100 %] 90 % (08/12 0500) FiO2 (%):  [35 %-50 %] 35 % (08/11 2307) Weight:  [113.8 kg] 113.8 kg (08/11 1400)    Intake/Output from previous day: 08/11 0701 - 08/12 0700 In: 6369.1 [I.V.:5090.8; IV Piggyback:1278.4] Out: 1950 [Urine:150; Emesis/NG output:150; Drains:140; Blood:10] Intake/Output this shift: Total I/O In: 742.5 [I.V.:397.5; IV Piggyback:345] Out: 60 [Drains:60]  General: pleasant and in moderate distress HEENT: NGT in place Pulm: no increased work of breathing Abdominal: Soft, moderately distended. Mild, diffuse tenderness to palpation; no rebound tenderness or guarding. LLQ drain in place with clear yellow output. Honeycomb in place. Extremities: no peripheral edema appreciated Psych: A&O x3   Lab Results:  Recent Labs    02/05/23 0400  WBC 24.9*  HGB 12.4  HCT 35.9*  PLT 397   BMET Recent Labs    02/05/23 0400 02/05/23 1416  NA 130* 131*  K 2.6* 3.1*  CL 88* 93*  CO2 29 27  GLUCOSE 124* 126*  BUN 25* 19  CREATININE 0.80 0.76  CALCIUM 8.4* 7.9*   PT/INR Recent Labs    02/05/23 0615  LABPROT 15.9*  INR 1.2     Studies/Results: DG Abd 1 View  Result Date: 02/06/2023 CLINICAL DATA:  NG tube placement. EXAM: ABDOMEN - 1 VIEW COMPARISON:  No comparison studies available. FINDINGS: 0451 hours. NG tube tip is in the gastric fundus with side port of the NG tube below the GE junction. Basilar atelectasis noted bilaterally with tiny left pleural effusion. Telemetry leads overlie the chest. IMPRESSION: NG tube tip is in the gastric fundus. Electronically Signed   By: Kennith Center M.D.   On: 02/06/2023 05:38   DG Chest Port 1 View  Result Date: 02/05/2023 CLINICAL DATA:  ET and OG tube check, post intubation EXAM: PORTABLE CHEST 1 VIEW COMPARISON:  02/05/2023 at 0457 hours FINDINGS: Endotracheal tube terminates 5 cm above the carina. Low lung volumes. Mild patchy opacities in the upper lungs bilaterally, new, favoring mild interstitial edema given rapid change, although mild infection/pneumonia is possible. Bilateral lower lobe opacities, likely atelectasis, unchanged. Small left pleural effusion, new. The heart is top-normal in size. Enteric tube terminates in the mid gastric body. IMPRESSION: Endotracheal tube terminates 5 cm above the carina. Mild patchy upper lung opacities, new, favoring mild interstitial edema. In the appropriate clinical setting, mild infection/pneumonia is also possible. Associated small left pleural effusion, new. Electronically Signed   By: Charline Bills M.D.   On: 02/05/2023 17:17   CT ABDOMEN PELVIS W CONTRAST  Result Date: 02/05/2023 CLINICAL DATA:  42 year old female with history of nonlocalized abdominal pain. EXAM: CT ABDOMEN AND PELVIS  WITH CONTRAST TECHNIQUE: Multidetector CT imaging of the abdomen and pelvis was performed using the standard protocol following bolus administration of intravenous contrast. RADIATION DOSE REDUCTION: This exam was performed according to the departmental dose-optimization program which includes automated exposure control, adjustment of the mA  and/or kV according to patient size and/or use of iterative reconstruction technique. CONTRAST:  OMNIPAQUE IOHEXOL 300 MG/ML  SOLN COMPARISON:  CT of the abdomen and pelvis 08/23/2014. FINDINGS: Lower chest: Patchy areas of subsegmental atelectasis and/or scarring are noted in the lung bases bilaterally. Hepatobiliary: Amorphous area of low attenuation or hypoperfusion in the anterior aspect of segment 4B adjacent to the falciform ligament, strongly favored to represent a benign perfusion anomaly and/or focal fatty infiltration. No other suspicious appearing hepatic lesions are noted. No intra or extrahepatic biliary ductal dilatation. Gallbladder is unremarkable in appearance. Pancreas: No pancreatic mass. No pancreatic ductal dilatation. No pancreatic or peripancreatic fluid collections or inflammatory changes. Spleen: Unremarkable. Adrenals/Urinary Tract: Bilateral kidneys and adrenal glands are normal in appearance. No hydroureteronephrosis. Urinary bladder is unremarkable in appearance. Stomach/Bowel: Stomach is moderately distended. Duodenum is decompressed. There are multiple borderline dilated and dilated loops of small bowel throughout the abdomen with multiple air-fluid levels. Small bowel loops measure up to 5.2 cm in diameter. Several segmental areas of mural thickening, mucosal hyperenhancement and surrounding inflammation are noted, most severe in the region of the distal and terminal ileum where there is associated luminal narrowing. Extensive surrounding inflammation is also noted in the region of the distal and terminal ileum, as well as some extraluminal gas and fluid, indicative of perforation, best appreciated on axial image 75 of series 2 where there is extraluminal gas and fluid with an air-fluid level. Proximal appendix also appears dilated and inflamed measuring up to 1.5 cm in diameter, although the distal aspect of the appendix is normal in caliber. Vascular/Lymphatic: Mild  atherosclerosis in the abdominal aorta and pelvic vasculature, without evidence of aneurysm or dissection. No lymphadenopathy noted in the abdomen or pelvis. Reproductive: Previously noted IUD has been removed. Uterus and ovaries are otherwise unremarkable in appearance. Other: Small volume of free fluid. Small amount of pneumoperitoneum centered in the right lower quadrant of the abdomen. Musculoskeletal: There are no aggressive appearing lytic or blastic lesions noted in the visualized portions of the skeleton. IMPRESSION: 1. Study is positive for bowel perforation. Overall, the appearance is suggestive of inflammatory bowel disease such as Crohn's enteritis, as detailed above, with probable perforation in the region of the distal and terminal ileum, and multiple loops of dilated mid small bowel suggestive of associated bowel obstruction. Surgical consultation is strongly recommended. 2. The base of the appendix is also dilated and inflamed, however, this is favored to be part of the underlying inflammatory bowel disease rather indicative of acute appendicitis. 3. Aortic atherosclerosis. 4. Additional incidental findings, as above. Critical Value/emergent results were called by telephone at the time of interpretation on 02/05/2023 at 5:56 am to provider Zadie Rhine, who verbally acknowledged these results. Electronically Signed   By: Trudie Reed M.D.   On: 02/05/2023 05:59   DG Chest Portable 1 View  Result Date: 02/05/2023 CLINICAL DATA:  42 year old female with history of chest pain. Hemoptysis. EXAM: PORTABLE CHEST 1 VIEW COMPARISON:  No priors. FINDINGS: Lung volumes are low. There bibasilar opacities which are very linear in appearance, favored to reflect areas of subsegmental atelectasis or scarring. No definite consolidative airspace disease. No pleural effusions. No pneumothorax. No evidence of pulmonary edema. Heart size  is normal. Upper mediastinal contours are within normal limits.  IMPRESSION: 1. Low lung volumes with bibasilar areas of subsegmental atelectasis or scarring. Electronically Signed   By: Trudie Reed M.D.   On: 02/05/2023 05:28    Anti-infectives: Anti-infectives (From admission, onward)    Start     Dose/Rate Route Frequency Ordered Stop   02/05/23 1400  piperacillin-tazobactam (ZOSYN) IVPB 3.375 g        3.375 g 12.5 mL/hr over 240 Minutes Intravenous Every 8 hours 02/05/23 1350 02/10/23 1359   02/05/23 0515  piperacillin-tazobactam (ZOSYN) IVPB 3.375 g        3.375 g 100 mL/hr over 30 Minutes Intravenous  Once 02/05/23 0509 02/05/23 0608       Assessment/Plan: s/p Procedure(s): EXPLORATION LAPAROTOMY APPENDECTOMY  Mia Waters is a 42 y.o. female with Mhx significant for Asthma, HTN, GERD, and IBS-C who presented to the hospital on 8/11 with peritonitis and CT evidence of perforated bowel/inflamed appendix. She underwent an urgent exploratory laparotomy 8/11 which showed a perforated appendix and pus in the abdomen. She was left intubated secondary to concerns for wheezing however, patient self-extubated 8/12. Patient having significant abdominal pain this morning. Her leukocytosis is downtrending, she is afebrile, HR is WNL, and her abdomen is soft, all reassuring. Her pain is likely secondary to having undergone a major surgery and also having an ileus. She has received lots of fluids and is hyponatremic but she is not significantly volume up on exam and notes breathing well. Plan to continue NGT and control pain. Also decrease mIVF and obtain AM CXR.   - Continue NGT - NPO - Replete potassium with Na Phos - Decrease mIVF to 100 - AM CXR today - Scheduled pain control in antiemetics, add gabapentin and robaxin - Continue antibiotics and narrow following anaerobic/aerobic culture  - 8/11 showing moderate GPC and GNR - Continue bowel regimen with docusate and miralax - Continue to monitor urine output - Continue to monitor triglycerides     LOS: 1 day    Dominica Severin 02/06/2023

## 2023-02-06 NOTE — Progress Notes (Signed)
Critical called positive MRSA in the nares result. Mupirocin ordered per protocol.

## 2023-02-06 NOTE — Plan of Care (Signed)
  Problem: Acute Rehab PT Goals(only PT should resolve) Goal: Pt will Roll Supine to Side Outcome: Progressing Flowsheets (Taken 02/06/2023 1305) Pt will Roll Supine to Side: with supervision Goal: Pt Will Go Supine/Side To Sit Outcome: Progressing Flowsheets (Taken 02/06/2023 1305) Pt will go Supine/Side to Sit: with contact guard assist Goal: Pt Will Go Sit To Supine/Side Outcome: Progressing Flowsheets (Taken 02/06/2023 1305) Pt will go Sit to Supine/Side: with contact guard assist Goal: Patient Will Transfer Sit To/From Stand Outcome: Progressing Flowsheets (Taken 02/06/2023 1305) Patient will transfer sit to/from stand:  with contact guard assist  with supervision Goal: Pt Will Transfer Bed To Chair/Chair To Bed Outcome: Progressing Flowsheets (Taken 02/06/2023 1305) Pt will Transfer Bed to Chair/Chair to Bed:  with supervision  with contact guard assist Goal: Pt Will Ambulate Outcome: Progressing Flowsheets (Taken 02/06/2023 1305) Pt will Ambulate:  25 feet  with contact guard assist  with least restrictive assistive device Goal: Pt Will Go Up/Down Stairs Outcome: Progressing Flowsheets (Taken 02/06/2023 1305) Pt will Go Up / Down Stairs:  3-5 stairs  with contact guard assist  with rail(s)    Katina Dung. Hartnett-Rands, MS, PT [facility] [phone] 02/06/2023

## 2023-02-06 NOTE — Progress Notes (Signed)
Patient has requested patient advocate. AC made aware. Chaplain consult requested. Elink RN made aware to assist with checking in on patient and with comforting patient while primary RN unable to be in room with patient.

## 2023-02-06 NOTE — Progress Notes (Signed)
Patient continues to endorse pain and now acute onset anxiety. Patient also complaining of heartburn and requesting to escalate concerns. Spoke with MD Lovell Sheehan, awaiting orders for antianxiety and pain meds. Relayed to patient that she is on IV protonix to help with heartburn.

## 2023-02-07 ENCOUNTER — Encounter (HOSPITAL_COMMUNITY): Payer: Self-pay | Admitting: General Surgery

## 2023-02-07 LAB — GLUCOSE, CAPILLARY
Glucose-Capillary: 111 mg/dL — ABNORMAL HIGH (ref 70–99)
Glucose-Capillary: 96 mg/dL (ref 70–99)
Glucose-Capillary: 87 mg/dL (ref 70–99)
Glucose-Capillary: 96 mg/dL (ref 70–99)

## 2023-02-07 MED ORDER — POTASSIUM CHLORIDE 10 MEQ/100ML IV SOLN
10.0000 meq | INTRAVENOUS | Status: AC
  Administered 2023-02-07 (×5): 10 meq via INTRAVENOUS
  Filled 2023-02-07 (×5): qty 100

## 2023-02-07 MED ORDER — GABAPENTIN 100 MG PO CAPS
100.0000 mg | ORAL_CAPSULE | Freq: Three times a day (TID) | ORAL | Status: DC
  Administered 2023-02-07 – 2023-02-08 (×2): 100 mg via ORAL
  Filled 2023-02-07 (×2): qty 1

## 2023-02-07 MED ORDER — ACETAMINOPHEN 650 MG RE SUPP: 650.0000 mg | Freq: Four times a day (QID) | RECTAL | Status: DC | PRN

## 2023-02-07 MED ORDER — ZOLPIDEM TARTRATE 5 MG PO TABS
5.0000 mg | ORAL_TABLET | Freq: Every evening | ORAL | Status: DC | PRN
  Filled 2023-02-07: qty 1

## 2023-02-07 MED ORDER — ACETAMINOPHEN 325 MG PO TABS
650.0000 mg | ORAL_TABLET | Freq: Four times a day (QID) | ORAL | Status: DC | PRN
  Administered 2023-02-09 (×2): 650 mg via ORAL
  Filled 2023-02-07 (×2): qty 2

## 2023-02-07 MED ORDER — POLYETHYLENE GLYCOL 3350 17 G PO PACK
17.0000 g | PACK | Freq: Every day | ORAL | Status: DC
  Administered 2023-02-08 – 2023-02-09 (×2): 17 g via ORAL
  Filled 2023-02-07 (×2): qty 1

## 2023-02-07 MED ORDER — POTASSIUM PHOSPHATES 15 MMOLE/5ML IV SOLN
15.0000 mmol | Freq: Once | INTRAVENOUS | Status: AC
  Administered 2023-02-07: 15 mmol via INTRAVENOUS
  Filled 2023-02-07: qty 5

## 2023-02-07 MED ORDER — OXYCODONE HCL 5 MG PO TABS
5.0000 mg | ORAL_TABLET | ORAL | Status: DC | PRN
  Administered 2023-02-07 – 2023-02-10 (×8): 5 mg via ORAL
  Filled 2023-02-07 (×11): qty 1

## 2023-02-07 MED ORDER — DOCUSATE SODIUM 100 MG PO CAPS
100.0000 mg | ORAL_CAPSULE | Freq: Two times a day (BID) | ORAL | Status: DC
  Administered 2023-02-07 – 2023-02-13 (×12): 100 mg via ORAL
  Filled 2023-02-07 (×12): qty 1

## 2023-02-07 NOTE — TOC CM/SW Note (Signed)
CSW spoke with pt about outpatient PT referral being made as PT is recommending follow up with therapy outpatient. Pt states she does not feel this is needed. CSW explained if she changes her mind she can reach out to her PCP and pt is understanding. TOC signing off.

## 2023-02-07 NOTE — Progress Notes (Signed)
2 Days Post-Op  Subjective: Patient seen and examined. She appears uncomfortable in bed. She endorses abdominal pain and inability to sleep but says she is feeling slightly better today. Patient advised to be kind to herself.   Per nurse, she was having nightmares from Ambien last night. She also take Wellbutrin at home.  NGT with >1017ml output in 24 hr; drain output >400. Urine output ~800 ml.  Objective: Vital signs in last 24 hours: Temp:  [97.6 F (36.4 C)-98.1 F (36.7 C)] 98 F (36.7 C) (08/13 0518) Pulse Rate:  [88-104] 92 (08/13 0518) Resp:  [11-25] 12 (08/13 0518) BP: (112-138)/(60-84) 119/71 (08/13 0500) SpO2:  [90 %-99 %] 95 % (08/13 0518) Weight:  [161 kg] 115 kg (08/13 0518) Last BM Date :  (Per pt. approx. 6 days ago.)  Intake/Output from previous day: 08/12 0701 - 08/13 0700 In: 2205.5 [I.V.:1466.6; NG/GT:180; IV Piggyback:558.9] Out: 2475 [Urine:775; Emesis/NG output:1250; Drains:450] Intake/Output this shift: Total I/O In: 180 [NG/GT:180] Out: 1145 [Urine:375; Emesis/NG output:550; Drains:220]  General: sluggish and in mild distress HEENT: NGT in place Pulm: no increased work of breathing Abdominal: Soft, mildly distended. Mild diffuse tenderness to palpation; no rebound tenderness or guarding. LLQ drain in place without drainage. Midline staples with honeycomb in place. Extremities: no peripheral edema appreciated Psych: A&O x3   Lab Results:  Recent Labs    02/06/23 0526 02/07/23 0515  WBC 17.5* 13.0*  HGB 12.3 11.0*  HCT 36.6 33.3*  PLT 438* 441*   BMET Recent Labs    02/05/23 1416 02/06/23 0526  NA 131* 131*  K 3.1* 2.9*  CL 93* 92*  CO2 27 28  GLUCOSE 126* 136*  BUN 19 15  CREATININE 0.76 0.71  CALCIUM 7.9* 7.8*   PT/INR Recent Labs    02/05/23 0615  LABPROT 15.9*  INR 1.2    Studies/Results: DG Chest Port 1 View  Result Date: 02/06/2023 CLINICAL DATA:  Pulmonary edema EXAM: PORTABLE CHEST 1 VIEW COMPARISON:  02/05/2023  FINDINGS: Enteric tube in place. There is some linear opacity seen in the left lung base likely scar or atelectasis no consolidation, pneumothorax or edema. Tiny left effusion is stable overlapping cardiac leads. Normal cardiopericardial silhouette IMPRESSION: Interval removal of the ET tube. Underinflation with persistent basilar atelectasis or scar. Tiny left effusion. Stable enteric tube Electronically Signed   By: Karen Kays M.D.   On: 02/06/2023 13:38   DG Abd 1 View  Result Date: 02/06/2023 CLINICAL DATA:  NG tube placement. EXAM: ABDOMEN - 1 VIEW COMPARISON:  No comparison studies available. FINDINGS: 0451 hours. NG tube tip is in the gastric fundus with side port of the NG tube below the GE junction. Basilar atelectasis noted bilaterally with tiny left pleural effusion. Telemetry leads overlie the chest. IMPRESSION: NG tube tip is in the gastric fundus. Electronically Signed   By: Kennith Center M.D.   On: 02/06/2023 05:38   DG Chest Port 1 View  Result Date: 02/05/2023 CLINICAL DATA:  ET and OG tube check, post intubation EXAM: PORTABLE CHEST 1 VIEW COMPARISON:  02/05/2023 at 0457 hours FINDINGS: Endotracheal tube terminates 5 cm above the carina. Low lung volumes. Mild patchy opacities in the upper lungs bilaterally, new, favoring mild interstitial edema given rapid change, although mild infection/pneumonia is possible. Bilateral lower lobe opacities, likely atelectasis, unchanged. Small left pleural effusion, new. The heart is top-normal in size. Enteric tube terminates in the mid gastric body. IMPRESSION: Endotracheal tube terminates 5 cm above the carina. Mild  patchy upper lung opacities, new, favoring mild interstitial edema. In the appropriate clinical setting, mild infection/pneumonia is also possible. Associated small left pleural effusion, new. Electronically Signed   By: Charline Bills M.D.   On: 02/05/2023 17:17     Anti-infectives: Anti-infectives (From admission, onward)     Start     Dose/Rate Route Frequency Ordered Stop   02/05/23 1400  piperacillin-tazobactam (ZOSYN) IVPB 3.375 g        3.375 g 12.5 mL/hr over 240 Minutes Intravenous Every 8 hours 02/05/23 1350 02/10/23 1359   02/05/23 0515  piperacillin-tazobactam (ZOSYN) IVPB 3.375 g        3.375 g 100 mL/hr over 30 Minutes Intravenous  Once 02/05/23 0509 02/05/23 0608       Assessment/Plan: s/p Procedure(s): EXPLORATION LAPAROTOMY APPENDECTOMY  Mia Waters is a 42 y.o. female with Mhx significant for Asthma, HTN, GERD, Chronic Pain and IBS-C who presented to the hospital on 8/11 with peritonitis and CT evidence of perforated bowel/inflamed appendix. She underwent an urgent exploratory laparotomy 8/11 which showed a perforated appendix and pus in the abdomen. She was left intubated secondary to concerns for wheezing however, patient self-extubated 8/12.   Patient sluggish this morning. Her leukocytosis is downtrending, she is afebrile, HR is WNL, her abdomen is less distended, and her urine output is improving, all reassuring. Her pain is likely secondary to recent surgery and post-operative ileus. NGT still with high output, plan to continue NGT and control pain. Patient very anxious.  - Continue NGT - NPO - Replete potassium/phosphorus with K Phos - Discontinue Ambien  - mIVF at 100 ml/hr - Scheduled pain control in antiemetics - Continue antibiotics and narrow following anaerobic/aerobic culture  - 8/11 cx: moderate GNR - Continue bowel regimen with docusate and miralax - Ativan prn   LOS: 2 days    Dominica Severin 02/07/2023

## 2023-02-08 LAB — GLUCOSE, CAPILLARY
Glucose-Capillary: 99 mg/dL (ref 70–99)
Glucose-Capillary: 99 mg/dL (ref 70–99)
Glucose-Capillary: 94 mg/dL (ref 70–99)
Glucose-Capillary: 86 mg/dL (ref 70–99)

## 2023-02-08 MED ORDER — PANTOPRAZOLE SODIUM 40 MG PO TBEC
40.0000 mg | DELAYED_RELEASE_TABLET | Freq: Every day | ORAL | Status: DC
  Administered 2023-02-08 – 2023-02-12 (×5): 40 mg via ORAL
  Filled 2023-02-08 (×5): qty 1

## 2023-02-08 MED ORDER — LORAZEPAM 1 MG PO TABS
1.0000 mg | ORAL_TABLET | ORAL | Status: DC | PRN
  Administered 2023-02-08 – 2023-02-13 (×13): 1 mg via ORAL
  Filled 2023-02-08 (×13): qty 1

## 2023-02-08 MED ORDER — PREGABALIN 75 MG PO CAPS
150.0000 mg | ORAL_CAPSULE | Freq: Three times a day (TID) | ORAL | Status: DC
  Administered 2023-02-08 – 2023-02-13 (×16): 150 mg via ORAL
  Filled 2023-02-08 (×16): qty 2

## 2023-02-08 MED ORDER — POTASSIUM CHLORIDE 10 MEQ/100ML IV SOLN
10.0000 meq | INTRAVENOUS | Status: AC
  Administered 2023-02-08 (×5): 10 meq via INTRAVENOUS
  Filled 2023-02-08 (×5): qty 100

## 2023-02-08 MED ORDER — BUPROPION HCL ER (SR) 150 MG PO TB12
150.0000 mg | ORAL_TABLET | Freq: Two times a day (BID) | ORAL | Status: DC
  Administered 2023-02-08 – 2023-02-13 (×11): 150 mg via ORAL
  Filled 2023-02-08 (×11): qty 1

## 2023-02-08 MED ORDER — AMLODIPINE BESYLATE 5 MG PO TABS
10.0000 mg | ORAL_TABLET | Freq: Every day | ORAL | Status: DC
  Administered 2023-02-08 – 2023-02-13 (×6): 10 mg via ORAL
  Filled 2023-02-08 (×6): qty 2

## 2023-02-08 NOTE — Progress Notes (Addendum)
Patient appears to be sleeping (snoring) and when writer entered room, patient wakes up and states she is in so much pain. Explained to patient that PRN pain meds have been given to which patient argues that they have not been and doesn't recall it. Patient is alert and oriented x4 but once awake, immediately starts moaning and wanting pain meds. Both PO and IV pain meds given and writer encouraged patient to get up out of bed and ambulate to commode. Patient stated she had to urinate but wanted pain meds first. Patient educated that she was just given the medication and shown that she was getting it. Patient refusing to get up out of bed and becomes agitated wanting more medication. Explained the importance of getting up to help with abdominal pain and work the abdomen to keep things moving along. Patient then agreeable to sit up on side of bed and did so with assistance of Clinical research associate and Psychologist, sport and exercise. Patient then stood up and walked to the commode with walker. Patient then apologetic and stated she knows we are just trying to do what's best for her to get her home. Writer reassured patient that prn pain meds will be given as soon as it can be and plan of care gone over with patient and patient expressed full understanding. Dr Lovell Sheehan also made aware of patient pain and concerns. PT in to see patient to assist patient back to bed from commode. Will continue to monitor.  Patient advocate notified per patient request and stated they will stop by to speak with her.

## 2023-02-08 NOTE — Progress Notes (Signed)
Physical Therapy Treatment Patient Details Name: Mia Waters MRN: [id number] DOB: [birth year] Today's Date: 02/08/2023   History of Present Illness Since surgery yesterday, patient self extubated and pulled out NG tube. NG tube was replaced.    Patient seen and examined. She notes throbbing abdominal pain in her right lower quadrant and says that her pain last night was worse than it was prior to surgery. She endorses some nausea but has not vomited. she denies any shortness of breath. She has not had a bowel movement and is concerned with her pain could be related to an obstruction given she has IBS-C. She normally has bowel movements every five days. Discussed heavily that this is likely an ileus with patient.    PT Comments  Pt was up with nursing using BSC upon entering.  Noted drowsiness from recent medications but otherwise without c/o pain and agreeable to walking back to bed.  Pt was not agreeable to sitting up in recliner as reports she is unable to get comfortable.  Pt able to complete self hygiene following voiding in [facility] with min assist from therapist to steady patient and assist with gown/IV.  Pt completed 25 steps back to bed with one incident of LOB, min assist to correct.  Min-mod assist to return to supine and position correctly in bed.  Pt reported no pain upon return, did express some discomfort during transitioning of positions.                  Precautions / Restrictions Precautions Precautions: Fall Precaution Comments: patient reports no falls in the last six months Restrictions Weight Bearing Restrictions: No     Mobility  Bed Mobility Overal bed mobility: Needs Assistance Bed Mobility: Sit to Supine     Supine to sit: Min assist, HOB elevated, Used rails     General bed mobility comments: slow, labored movements    Transfers Overall transfer level: Needs assistance Equipment used: Rolling walker (2 wheels) Transfers: Sit to/from Stand, Bed to  chair/wheelchair/BSC Sit to Stand: Min assist, Contact guard assist   Step pivot transfers: Contact guard assist   Anterior-Posterior transfers: Supervision   General transfer comment: slow, labored movements    Ambulation/Gait Ambulation/Gait assistance: Contact guard assist Gait Distance (Feet): 25 Feet Assistive device: Rolling walker (2 wheels) Gait Pattern/deviations: Step-to pattern, Decreased step length - right, Decreased step length - left, Decreased stride length, Shuffle Gait velocity: decreased     General Gait Details: slow, labored cadence with RW using short shuffling steps from bed to chair; limited by pain; on 2LMP O2 throughout         Cognition Arousal: Alert Behavior During Therapy: WFL for tasks assessed/performed Overall Cognitive Status: Within Functional Limits for tasks assessed                                                 Pertinent Vitals/Pain Pain Assessment Pain Assessment: 0-10 Pain Score: 4  Breathing: normal Body Language: relaxed     PT Goals (current goals can now be found in the care plan section) Acute Rehab PT Goals Patient Stated Goal: Go home with help from children. PT Goal Formulation: With patient Time For Goal Achievement: 02/20/23 Potential to Achieve Goals: Good Progress towards PT goals: Progressing toward goals       Time: 1012-1028 PT Time Calculation (min) (ACUTE ONLY):  16 min  Charges:    $Gait Training: 8-22 mins PT General Charges $$ ACUTE PT VISIT: 1 Visit                     Mia Waters, PTA/CLT [facility] Ph: [phone]   Mia, Waters 02/08/2023, 11:06 AM

## 2023-02-08 NOTE — Progress Notes (Signed)
Nurse at bedside ,patient c/o abdominal pain and discomfort, Dilaudid 1 mg given IV per MAR prn. Plan of care on going.

## 2023-02-08 NOTE — Plan of Care (Signed)

## 2023-02-08 NOTE — Progress Notes (Signed)
Nurse at bedside,patient  c/o abdominal pain,patient given Dilaudid  1 mg per MAR prn orders. Plan of care on going .

## 2023-02-08 NOTE — Progress Notes (Signed)
3 Days Post-Op  Subjective: Patient seen and examined. She appears fatigued in bed. She endorses abdominal pain and inability to sleep. Says she is feeling similar to yesterday and that her pain is similar to yesterday. She denies any vomiting but is having some nausea. Patient advised to take it slow.  NGT was removed yesterday afternoon. NGT with 500 ml output in 12 hr. Drain output >400.  Objective: Vital signs in last 24 hours: Temp:  [97.5 F (36.4 C)-98.8 F (37.1 C)] 97.5 F (36.4 C) (08/14 0459) Pulse Rate:  [90-102] 96 (08/14 0500) Resp:  [15-28] 18 (08/14 0500) BP: (117-159)/(70-101) 138/95 (08/14 0500) SpO2:  [91 %-99 %] 97 % (08/14 0500) Weight:  [644 kg] 115 kg (08/14 0459) Last BM Date :  (Per pt. approx. 6 days ago.)  Intake/Output from previous day: 08/13 0701 - 08/14 0700 In: 2750.1 [I.V.:1994.1; IV Piggyback:756] Out: 1255 [Urine:325; Emesis/NG output:500; Drains:430] Intake/Output this shift: Total I/O In: 1087.9 [I.V.:980.1; IV Piggyback:107.8] Out: 150 [Drains:150]  General: sluggish and in mild distress HEENT: NGT in place Pulm: no increased work of breathing Abdominal: Soft, mildly distended. Mild diffuse tenderness to palpation, mainly in the lower quadrants; no rebound tenderness or guarding. LLQ drain in place with minimal clear yellow drainage. Midline staples with honeycomb in place. Extremities: no peripheral edema appreciated Psych: A&O x3   Lab Results:  Recent Labs    02/07/23 0515 02/08/23 0517  WBC 13.0* 15.2*  HGB 11.0* 10.8*  HCT 33.3* 32.9*  PLT 441* 479*   BMET Recent Labs    02/07/23 0515 02/08/23 0517  NA 133* 133*  K 3.0* 3.2*  CL 94* 96*  CO2 30 28  GLUCOSE 101* 103*  BUN 18 14  CREATININE 0.68 0.57  CALCIUM 7.9* 7.9*   PT/INR No results for input(s): "LABPROT", "INR" in the last 72 hours.   Studies/Results: DG Chest Port 1 View  Result Date: 02/06/2023 CLINICAL DATA:  Pulmonary edema EXAM: PORTABLE CHEST 1  VIEW COMPARISON:  02/05/2023 FINDINGS: Enteric tube in place. There is some linear opacity seen in the left lung base likely scar or atelectasis no consolidation, pneumothorax or edema. Tiny left effusion is stable overlapping cardiac leads. Normal cardiopericardial silhouette IMPRESSION: Interval removal of the ET tube. Underinflation with persistent basilar atelectasis or scar. Tiny left effusion. Stable enteric tube Electronically Signed   By: Karen Kays M.D.   On: 02/06/2023 13:38     Anti-infectives: Anti-infectives (From admission, onward)    Start     Dose/Rate Route Frequency Ordered Stop   02/05/23 1400  piperacillin-tazobactam (ZOSYN) IVPB 3.375 g        3.375 g 12.5 mL/hr over 240 Minutes Intravenous Every 8 hours 02/05/23 1350 02/10/23 1359   02/05/23 0515  piperacillin-tazobactam (ZOSYN) IVPB 3.375 g        3.375 g 100 mL/hr over 30 Minutes Intravenous  Once 02/05/23 0509 02/05/23 0608       Assessment/Plan: s/p Procedure(s): EXPLORATION LAPAROTOMY APPENDECTOMY  Mia Waters is a 42 y.o. female with Mhx significant for Asthma, HTN, GERD, Chronic Pain and IBS-C who presented to the hospital on 8/11 with peritonitis and CT evidence of perforated bowel/inflamed appendix. She underwent an urgent exploratory laparotomy 8/11 which showed a perforated appendix and pus in the abdomen. She was left intubated secondary to concerns for wheezing however, patient self-extubated 8/12. Foley removed 8/13.  Patient is stable. Leukocytosis increased (13 ->15.2) and likely from postoperative stress. She is afebrile, HR is WNL,  and her urine output is improving, all reassuring. Plan to hold off on NGT tube. Monitor for bowel movement.  - NPO, consider clears in the afternoon - Monitor for bowel movement - Replete potassium - Discontinue Ambien  - mIVF at 100 ml/hr - Scheduled pain control and antiemetics - Continue antibiotics and narrow following anaerobic/aerobic culture  - 8/11 cx:  moderate GNR - Continue bowel regimen with docusate and miralax - Ativan prn   LOS: 3 days    Dominica Severin 02/08/2023

## 2023-02-09 ENCOUNTER — Inpatient Hospital Stay (HOSPITAL_COMMUNITY): Payer: Medicaid Other

## 2023-02-09 LAB — CBC
HCT: 32.7 % — ABNORMAL LOW (ref 36.0–46.0)
Hemoglobin: 10.7 g/dL — ABNORMAL LOW (ref 12.0–15.0)
MCH: 28.8 pg (ref 26.0–34.0)
MCHC: 32.7 g/dL (ref 30.0–36.0)
MCV: 88.1 fL (ref 80.0–100.0)
Platelets: 521 10*3/uL — ABNORMAL HIGH (ref 150–400)
RBC: 3.71 MIL/uL — ABNORMAL LOW (ref 3.87–5.11)
RDW: 15.5 % (ref 11.5–15.5)
WBC: 20.6 10*3/uL — ABNORMAL HIGH (ref 4.0–10.5)
nRBC: 0 % (ref 0.0–0.2)

## 2023-02-09 LAB — BASIC METABOLIC PANEL
Anion gap: 9 (ref 5–15)
BUN: 10 mg/dL (ref 6–20)
CO2: 27 mmol/L (ref 22–32)
Calcium: 7.9 mg/dL — ABNORMAL LOW (ref 8.9–10.3)
Chloride: 96 mmol/L — ABNORMAL LOW (ref 98–111)
Creatinine, Ser: 0.5 mg/dL (ref 0.44–1.00)
GFR, Estimated: >60 mL/min (ref >60)
Glucose, Bld: 105 mg/dL — ABNORMAL HIGH (ref 70–99)
Potassium: 3.2 mmol/L — ABNORMAL LOW (ref 3.5–5.1)
Sodium: 132 mmol/L — ABNORMAL LOW (ref 135–145)

## 2023-02-09 MED ORDER — IOHEXOL 9 MG/ML PO SOLN
ORAL | Status: AC
  Filled 2023-02-09: qty 1000

## 2023-02-09 MED ORDER — IOHEXOL 300 MG/ML  SOLN
100.0000 mL | Freq: Once | INTRAMUSCULAR | Status: AC | PRN
  Administered 2023-02-09: 100 mL via INTRAVENOUS

## 2023-02-09 MED ORDER — IOHEXOL 9 MG/ML PO SOLN: 500.0000 mL | ORAL | Status: AC

## 2023-02-09 NOTE — Progress Notes (Signed)
Patient requesting new diet, Dr. Lovell Sheehan made aware

## 2023-02-09 NOTE — Progress Notes (Signed)
Patient complained of pain 8/10. Patient given prn IV pain medication. Requested assistance to the bathroom. Patient was able to get up to the bedside with no assistance and ambulated with standby assist to the restroom. Plan of care ongoing.

## 2023-02-09 NOTE — Progress Notes (Signed)
Patient transferred to [facility] with walker

## 2023-02-09 NOTE — Progress Notes (Signed)
Patient got up to [facility] for me and nurse tech Tayler to clean her up. Patient is on her menstrual cycle so cleaned perineum and applied mesh panties and pad. Pt. Was only able to urinate a little. No BM. Bowels sounds are active though. Pt. Tolerated well but complained of pain throughout. Dilaudid 1mg  IV given once patient back resting in bed. She is less drowsy and respirations are better at 18 breaths per minute.

## 2023-02-09 NOTE — Progress Notes (Addendum)
4 Days Post-Op  Subjective: Patient seen and examined. She is resting in bed. She is feeling better than she has the previous days. Still notes abdominal pain, mainly with movement. She is passing flatus and burping and feels she may have a bowel movement coming. Lengthy discussion regarding need for PO pains meds, diet, and movement. She would like to eat.  Drain output ~350 in 24 hrs.  Objective: Vital signs in last 24 hours: Temp:  [97.6 F (36.4 C)-98.6 F (37 C)] 97.7 F (36.5 C) (08/15 0328) Pulse Rate:  [90-100] 91 (08/15 0328) Resp:  [13-22] 20 (08/15 0328) BP: (119-143)/(77-93) 119/88 (08/15 0328) SpO2:  [92 %-98 %] 98 % (08/15 0328) Last BM Date :  (Per pt. approx. 6 days ago.)  Intake/Output from previous day: 08/14 0701 - 08/15 0700 In: 240 [P.O.:240] Out: 1090 [Urine:750; Drains:340] Intake/Output this shift: No intake/output data recorded.  General: tired Pulm: no increased work of breathing Abdominal: Soft, mildly distended. RLQ tenderness to palpation, no rebound tenderness or guarding. LLQ drain in place with minimal serous drainage. Midline staples with honeycomb in place. Extremities: no peripheral edema appreciated Psych: A&O x3   Lab Results:  Recent Labs    02/08/23 0517 02/09/23 0442  WBC 15.2* 20.6*  HGB 10.8* 10.7*  HCT 32.9* 32.7*  PLT 479* 521*   BMET Recent Labs    02/08/23 0517 02/09/23 0442  NA 133* 132*  K 3.2* 3.2*  CL 96* 96*  CO2 28 27  GLUCOSE 103* 105*  BUN 14 10  CREATININE 0.57 0.50  CALCIUM 7.9* 7.9*   PT/INR No results for input(s): "LABPROT", "INR" in the last 72 hours.   Studies/Results: No results found.   Anti-infectives: Anti-infectives (From admission, onward)    Start     Dose/Rate Route Frequency Ordered Stop   02/05/23 1400  piperacillin-tazobactam (ZOSYN) IVPB 3.375 g        3.375 g 12.5 mL/hr over 240 Minutes Intravenous Every 8 hours 02/05/23 1350 02/10/23 1359   02/05/23 0515   piperacillin-tazobactam (ZOSYN) IVPB 3.375 g        3.375 g 100 mL/hr over 30 Minutes Intravenous  Once 02/05/23 0509 02/05/23 0608       Assessment/Plan: s/p Procedure(s): EXPLORATION LAPAROTOMY APPENDECTOMY  Stevie D Keightley is a 42 y.o. female with Mhx significant for Asthma, HTN, GERD, Chronic Pain and IBS-C who presented to the hospital on 8/11 with peritonitis and CT evidence of perforated bowel/inflamed appendix. She underwent an urgent exploratory laparotomy 8/11 which showed a perforated appendix and pus in the abdomen. She was left intubated secondary to concerns for wheezing however, patient self-extubated 8/12. Foley removed 8/13. She was transferred to the floor on 8/15.  Patient is doing well clinically and her pain is improved. Leukocytosis continuing to increase (15.2 -> 20.6) but patient doing well. She is afebrile, HR is WNL, and her urine output is improving. However, given she had a perforated appendicitis and is at a higher risk to develop an intraabdominal fluid collection, will obtain CT. Plan to advance diet and transition to PO pain meds as able.  - CT AP with IV contrast - Full liquid diet, consider surgical GI in the afternoon if she has a bowel movement - Monitor for bowel movement - Replete potassium - mIVF at 100 ml/hr - Scheduled pain control and antiemetics - Continue IV antibiotics and narrow according to anaerobic/aerobic culture. Consider switching to amoxicillin and cipro when tolerating PO  - 8/11 cx: moderate e.coli  and strep anginosis - Continue bowel regimen with docusate and miralax - Ativan prn   LOS: 4 days    Dominica Severin 02/09/2023

## 2023-02-09 NOTE — Progress Notes (Signed)
Patient presenting very anxious PRN ativan given .

## 2023-02-09 NOTE — Progress Notes (Signed)
Pt. Is still drowsy. Have started her on PO oxycodone. Patient has been told the importance of getting up. She notes that she only wants to get up if she can have dilaudid right before. I stated that might not be the best option considering how drowsy she gets with dilaudid and it wouldn't be safe. I told her to let oxycodone start to work and we will reevaluate.

## 2023-02-09 NOTE — Progress Notes (Signed)
Patient has been extremely drowsy/lethargic this morning, pale, and diaphoretic. VS are ok. When I try to wake patient she complains of "stomach pain." CT attempted to come get her but she want pain medication first. Discussed with Dr. Lovell Sheehan and he is comfortable with 0.5 mg of dilaudid.

## 2023-02-09 NOTE — Progress Notes (Signed)
Nurse secretary called me to patient's room. Upon entering patient's room, patient stated, " I need my pain medication". I explained the parameter of the prn medication to the patient. Plan of care on going.

## 2023-02-09 NOTE — Progress Notes (Signed)
Patient has called out multiple times in pain, PRN pain meds given multiple times (see MAR) patient also repositioned for comfort, patient oxygen saturation dropped slightly while sleeping and stating she had a bad dream, patient o2 increased and saturation returned to 94%, patient resting in bed at this time.

## 2023-02-10 ENCOUNTER — Encounter (HOSPITAL_COMMUNITY): Payer: Self-pay | Admitting: General Surgery

## 2023-02-10 LAB — CBC
HCT: 33.2 % — ABNORMAL LOW (ref 36.0–46.0)
Hemoglobin: 10.9 g/dL — ABNORMAL LOW (ref 12.0–15.0)
MCH: 28.6 pg (ref 26.0–34.0)
MCHC: 32.8 g/dL (ref 30.0–36.0)
MCV: 87.1 fL (ref 80.0–100.0)
Platelets: 596 10*3/uL — ABNORMAL HIGH (ref 150–400)
RBC: 3.81 MIL/uL — ABNORMAL LOW (ref 3.87–5.11)
RDW: 15.7 % — ABNORMAL HIGH (ref 11.5–15.5)
WBC: 18.5 10*3/uL — ABNORMAL HIGH (ref 4.0–10.5)
nRBC: 0 % (ref 0.0–0.2)

## 2023-02-10 LAB — BASIC METABOLIC PANEL
Anion gap: 8 (ref 5–15)
BUN: 6 mg/dL (ref 6–20)
CO2: 27 mmol/L (ref 22–32)
Calcium: 7.9 mg/dL — ABNORMAL LOW (ref 8.9–10.3)
Chloride: 98 mmol/L (ref 98–111)
Creatinine, Ser: 0.53 mg/dL (ref 0.44–1.00)
GFR, Estimated: >60 mL/min (ref >60)
Glucose, Bld: 120 mg/dL — ABNORMAL HIGH (ref 70–99)
Potassium: 3.2 mmol/L — ABNORMAL LOW (ref 3.5–5.1)
Sodium: 133 mmol/L — ABNORMAL LOW (ref 135–145)

## 2023-02-10 MED ORDER — POLYETHYLENE GLYCOL 3350 17 G PO PACK
17.0000 g | PACK | Freq: Two times a day (BID) | ORAL | Status: DC
  Administered 2023-02-10 – 2023-02-13 (×7): 17 g via ORAL
  Filled 2023-02-10 (×6): qty 1

## 2023-02-10 MED ORDER — BISACODYL 10 MG RE SUPP
10.0000 mg | Freq: Once | RECTAL | Status: AC
  Administered 2023-02-10: 10 mg via RECTAL
  Filled 2023-02-10: qty 1

## 2023-02-10 NOTE — Progress Notes (Signed)
5 Days Post-Op  Subjective: Patient seen and examined. She is resting in bed. Feeling about the same today. Still notes abdominal pain, mainly with movement. She stopped passing flatus today. She is still burping. Lengthy discussion regarding need for PO pains meds, diet, and movement.  Drain output ~200 in 24 hrs.  Objective: Vital signs in last 24 hours: Temp:  [97.7 F (36.5 C)-98.1 F (36.7 C)] 98 F (36.7 C) (08/16 0432) Pulse Rate:  [96-106] 106 (08/16 0432) Resp:  [12-20] 18 (08/16 0432) BP: (122-148)/(85-90) 148/90 (08/16 0432) SpO2:  [94 %-100 %] 100 % (08/16 0432) Last BM Date :  (Per pt. approx. 6 days ago.)  Intake/Output from previous day: 08/15 0701 - 08/16 0700 In: 600 [P.O.:600] Out: 810 [Urine:600; Drains:210] Intake/Output this shift: Total I/O In: -  Out: 140 [Drains:140]  General: tired-appearing Pulm: no increased work of breathing Abdominal: Hyperactive bowel sounds. Soft, mildly distended. RLQ tenderness to palpation, no rebound tenderness or guarding. LLQ drain in place with minimal serous drainage. Midline staples with honeycomb in place. Extremities: no peripheral edema appreciated Psych: A&O x3   Lab Results:  Recent Labs    02/09/23 0442 02/10/23 0428  WBC 20.6* 18.5*  HGB 10.7* 10.9*  HCT 32.7* 33.2*  PLT 521* 596*   BMET Recent Labs    02/09/23 0442 02/10/23 0428  NA 132* 133*  K 3.2* 3.2*  CL 96* 98  CO2 27 27  GLUCOSE 105* 120*  BUN 10 6  CREATININE 0.50 0.53  CALCIUM 7.9* 7.9*   PT/INR No results for input(s): "LABPROT", "INR" in the last 72 hours.   Studies/Results: CT ABDOMEN PELVIS W CONTRAST  Result Date: 02/09/2023 CLINICAL DATA:  Abdominal pain and emesis. Prior perforated viscus/appendicitis, status post appendectomy and exploratory laparotomy. EXAM: CT ABDOMEN AND PELVIS WITH CONTRAST TECHNIQUE: Multidetector CT imaging of the abdomen and pelvis was performed using the standard protocol following bolus  administration of intravenous contrast. RADIATION DOSE REDUCTION: This exam was performed according to the departmental dose-optimization program which includes automated exposure control, adjustment of the mA and/or kV according to patient size and/or use of iterative reconstruction technique. CONTRAST:  OMNIPAQUE IOHEXOL 300 MG/ML  SOLN COMPARISON:  02/05/2023 FINDINGS: Lower chest: Small bilateral pleural effusions. Atelectasis in the left lower lobe along with passive atelectasis in the right lower lobe. Mild cardiomegaly. Hepatobiliary: Unremarkable Pancreas: Unremarkable Spleen: Unremarkable Adrenals/Urinary Tract: 2 mm left kidney nonobstructive renal calculus. Otherwise unremarkable. Stomach/Bowel: Dilated loops of proximal small bowel up to 4.9 cm diameter. A transition point between dilated and nondilated loops of small bowel is suggested in the left abdomen on image 69 series 2, slight angulation of the bowel loop in this vicinity. Wall thickening in the vicinity of the terminal ileum with adjacent drain in place and edema tracking around the bowel in this vicinity likely related to local inflammation. I do not see a discrete abscess. Formed stool in the distal colon. Vascular/Lymphatic: Likely reactive central mesenteric lymph nodes. Reproductive: Unremarkable Other: Small amount of complex free pelvic fluid in the cul-de-sac. Edema in the mesentery, right greater than left. Trace fluid along the paracolic gutters and in the perihepatic space. No free intraperitoneal gas observed. Musculoskeletal: Degenerative facet arthropathy in the lower lumbar spine. IMPRESSION: 1. Dilated loops of proximal small bowel up to 4.9 cm diameter, with a transition point between dilated and nondilated loops of small bowel in the left abdomen. Small bowel obstruction is a possibility, as is ileus, correlate with bowel sounds.  2. Wall thickening in the vicinity of the terminal ileum with adjacent drain in place and  edema tracking around the bowel in this vicinity likely related to local inflammation. I do not see a discrete abscess. 3. Small amount of complex free pelvic fluid in the cul-de-sac. Trace fluid along the paracolic gutters and in the perihepatic space. 4. Small bilateral pleural effusions with atelectasis in the left lower lobe and passive atelectasis in the right lower lobe. 5. Mild cardiomegaly. 6. 2 mm left kidney nonobstructive renal calculus. 7. Degenerative facet arthropathy in the lower lumbar spine. Electronically Signed   By: Gaylyn Rong M.D.   On: 02/09/2023 14:48     Anti-infectives: Anti-infectives (From admission, onward)    Start     Dose/Rate Route Frequency Ordered Stop   02/05/23 1400  piperacillin-tazobactam (ZOSYN) IVPB 3.375 g        3.375 g 12.5 mL/hr over 240 Minutes Intravenous Every 8 hours 02/05/23 1350 02/10/23 1359   02/05/23 0515  piperacillin-tazobactam (ZOSYN) IVPB 3.375 g        3.375 g 100 mL/hr over 30 Minutes Intravenous  Once 02/05/23 0509 02/05/23 0608       Assessment/Plan: s/p Procedure(s): EXPLORATION LAPAROTOMY APPENDECTOMY  Mia Waters is a 42 y.o. female with Mhx significant for Asthma, HTN, GERD, Chronic Pain and IBS-C who presented to the hospital on 8/11 with peritonitis and CT evidence of perforated bowel/inflamed appendix. She underwent an urgent exploratory laparotomy 8/11 which showed a perforated appendix and pus in the abdomen. She was left intubated secondary to concerns for wheezing however, patient self-extubated 8/12. Foley removed 8/13. She was transferred to the floor on 8/15.  Patient is doing well clinically. Leukocytosis decreasing (18.5). CT obtained 8/15 for concerns for intraabdominal fluid collection was negative, showing some pelvic fluid, inflammation at the terminal ileum, and dilated loops of proximal small bowel with transition point. Patient with likely ileus. Plan to advance diet, increase bowel regimen, and  transition to PO pain meds as able.  - Full liquid diet - Suppository, and increase Miralax - Monitor for bowel movement - Replete potassium - mIVF at 100 ml/hr - Scheduled pain control and antiemetics - Continue IV antibiotics and narrow according to anaerobic/aerobic culture. Consider switching to amoxicillin and cipro when tolerating PO  - 8/11 cx: moderate e.coli and strep anginosis - Continue bowel regimen with docusate and miralax - Ativan prn   LOS: 5 days    Dominica Severin 02/10/2023

## 2023-02-10 NOTE — Progress Notes (Signed)
Patient is resting in her bed at this time. Slept some during the night. Assisted x2 to the bathroom with front wheel walker. Prn medications given x5 during this shift. Plan of care ongoing.

## 2023-02-10 NOTE — Progress Notes (Signed)
Physical Therapy Treatment Patient Details Name: Mia Waters MRN: [id number] DOB: [birth year] Today's Date: 02/10/2023   History of Present Illness Since surgery yesterday, patient self extubated and pulled out NG tube. NG tube was replaced.    Patient seen and examined. She notes throbbing abdominal pain in her right lower quadrant and says that her pain last night was worse than it was prior to surgery. She endorses some nausea but has not vomited. she denies any shortness of breath. She has not had a bowel movement and is concerned with her pain could be related to an obstruction given she has IBS-C. She normally has bowel movements every five days. Discussed heavily that this is likely an ileus with patient.    PT Comments  Pt supine in bed with RN in room at beginning of session.  Pt limited by abdominal pain (7/10), given pain medication before treatment.  Pt presents with improved mobility noted by no assistance required with bed mobility and transfer training, just cueing for hand placement to assist with sit to standing safely.  Able to increase distance during gait training, presents with slow labored cadence using RW safely with no LOB episodes.  EOS pt requested to return to bed for pain control.  Pt able to complete seated LE strengthening exercises.  Pt left in bed with HOB raised and call bell within reach.    If plan is discharge home, recommend the following: A little help with walking and/or transfers;A little help with bathing/dressing/bathroom;Help with stairs or ramp for entrance;Assist for transportation;Assistance with cooking/housework   Can travel by private vehicle        Equipment Recommendations  None recommended by PT    Recommendations for Other Services       Precautions / Restrictions Precautions Precautions: Fall Precaution Comments: patient reports no falls in the last six months Restrictions Weight Bearing Restrictions: No     Mobility  Bed  Mobility Overal bed mobility: Independent       Supine to sit: Modified independent (Device/Increase time), HOB elevated, Used rails     General bed mobility comments: slow, labored movements, use of rails    Transfers Overall transfer level: Modified independent Equipment used: Rolling walker (2 wheels)   Sit to Stand: Supervision           General transfer comment: cueing for hand placement, SBA, able to complete I with slow labored movements    Ambulation/Gait Ambulation/Gait assistance: Contact guard assist Gait Distance (Feet): 70 Feet   Gait Pattern/deviations: Decreased stride length, Shuffle, Step-through pattern Gait velocity: decreased     General Gait Details: safe mechanics with RW, slow labored cadence with no LOB.  Room air with vitals WNL, did c/o SOB initially upon return to room that was resolved shortly   Stairs             Wheelchair Mobility     Tilt Bed    Modified Rankin (Stroke Patients Only)       Balance                                            Cognition Arousal: Alert Behavior During Therapy: WFL for tasks assessed/performed Overall Cognitive Status: Within Functional Limits for tasks assessed  Exercises General Exercises - Lower Extremity Long Arc Quad: AROM, Strengthening, Both, Seated Hip Flexion/Marching: AROM, 10 reps, Strengthening, Both, Seated Toe Raises: AROM, Both, Seated Heel Raises: AROM, Both, 10 reps, Seated    General Comments        Pertinent Vitals/Pain Pain Assessment Pain Score: 7  Pain Location: abdomen post surgical Pain Descriptors / Indicators: Nagging, Stabbing, Shooting Pain Intervention(s): Limited activity within patient's tolerance, Monitored during session, Premedicated before session, Repositioned    Home Living                          Prior Function            PT Goals (current goals  can now be found in the care plan section)      Frequency    Min 3X/week      PT Plan      Co-evaluation              AM-PAC PT "6 Clicks" Mobility   Outcome Measure  Help needed turning from your back to your side while in a flat bed without using bedrails?: A Little Help needed moving from lying on your back to sitting on the side of a flat bed without using bedrails?: A Little Help needed moving to and from a bed to a chair (including a wheelchair)?: A Little Help needed standing up from a chair using your arms (e.g., wheelchair or bedside chair)?: A Little Help needed to walk in hospital room?: A Little Help needed climbing 3-5 steps with a railing? : A Lot 6 Click Score: 17    End of Session Equipment Utilized During Treatment: Gait belt Activity Tolerance: Patient limited by pain;Patient tolerated treatment well Patient left: in bed;with call bell/phone within reach Nurse Communication: Mobility status PT Visit Diagnosis: Unsteadiness on feet (R26.81);Other abnormalities of gait and mobility (R26.89)     Time: 0102-7253 PT Time Calculation (min) (ACUTE ONLY): 20 min  Charges:    $Therapeutic Activity: 8-22 mins PT General Charges $$ ACUTE PT VISIT: 1 Visit                     Becky Sax, LPTA/CLT; CBIS [phone]  Mia Waters 02/10/2023, 9:55 AM

## 2023-02-10 NOTE — Progress Notes (Signed)
Patient ambulated with assistance in hallway with front wheel walker during shift. Patient reported several complaints of pain during shift given PRN for pain, see MAR. Patients JP drain in place and charged to suction.

## 2023-02-11 LAB — CBC
HCT: 31 % — ABNORMAL LOW (ref 36.0–46.0)
Hemoglobin: 10.2 g/dL — ABNORMAL LOW (ref 12.0–15.0)
MCH: 28.8 pg (ref 26.0–34.0)
MCHC: 32.9 g/dL (ref 30.0–36.0)
MCV: 87.6 fL (ref 80.0–100.0)
Platelets: 568 10*3/uL — ABNORMAL HIGH (ref 150–400)
RBC: 3.54 MIL/uL — ABNORMAL LOW (ref 3.87–5.11)
RDW: 15.9 % — ABNORMAL HIGH (ref 11.5–15.5)
WBC: 19.1 10*3/uL — ABNORMAL HIGH (ref 4.0–10.5)
nRBC: 0 % (ref 0.0–0.2)

## 2023-02-11 LAB — BASIC METABOLIC PANEL
Anion gap: 6 (ref 5–15)
BUN: 6 mg/dL (ref 6–20)
CO2: 26 mmol/L (ref 22–32)
Calcium: 7.7 mg/dL — ABNORMAL LOW (ref 8.9–10.3)
Chloride: 101 mmol/L (ref 98–111)
Creatinine, Ser: 0.53 mg/dL (ref 0.44–1.00)
GFR, Estimated: >60 mL/min (ref >60)
Glucose, Bld: 118 mg/dL — ABNORMAL HIGH (ref 70–99)
Potassium: 3.6 mmol/L (ref 3.5–5.1)
Sodium: 133 mmol/L — ABNORMAL LOW (ref 135–145)

## 2023-02-11 MED ORDER — OXYCODONE HCL 5 MG PO TABS
10.0000 mg | ORAL_TABLET | Freq: Four times a day (QID) | ORAL | Status: DC | PRN
  Administered 2023-02-11 – 2023-02-12 (×4): 10 mg via ORAL
  Filled 2023-02-11 (×5): qty 2

## 2023-02-11 MED ORDER — KETOROLAC TROMETHAMINE 30 MG/ML IJ SOLN
30.0000 mg | Freq: Four times a day (QID) | INTRAMUSCULAR | Status: DC
  Administered 2023-02-11 – 2023-02-13 (×7): 30 mg via INTRAVENOUS
  Filled 2023-02-11 (×7): qty 1

## 2023-02-11 MED ORDER — CYCLOBENZAPRINE HCL 10 MG PO TABS
10.0000 mg | ORAL_TABLET | Freq: Three times a day (TID) | ORAL | Status: DC
  Administered 2023-02-11 – 2023-02-13 (×7): 10 mg via ORAL
  Filled 2023-02-11 (×7): qty 1

## 2023-02-11 MED ORDER — ACETAMINOPHEN 500 MG PO TABS
1000.0000 mg | ORAL_TABLET | Freq: Four times a day (QID) | ORAL | Status: DC
  Administered 2023-02-11 – 2023-02-13 (×7): 1000 mg via ORAL
  Filled 2023-02-11 (×9): qty 2

## 2023-02-11 NOTE — Progress Notes (Signed)
Patients complaining of pain 10/10 extreme pain while ambulating to the bathroom, NT reported patient was moaning while ambulating. Patient stated pain medication not working she is cold, complaining of discomfort at lungs, lung sounds clear diminished. Patient encouraged to use incentive spirometry, patient educated. Vital signs: T-98.7, P-109, R-20, BP-143/88, O2-97% at room air. MD Pappayliou made aware. New orders placed.

## 2023-02-11 NOTE — Progress Notes (Addendum)
Patient verbalized complaints of pain to abdomen several times during shift given PRN, see MAR. Encouraged patient to use incentive spirometry during shift, noted patient using during shift. Patient reported unable to ambulate in hallway due to pain.

## 2023-02-11 NOTE — Progress Notes (Signed)
[facility] Progress Note  6 Days Post-Op  Subjective: Patient seen and examined.  She is resting comfortably in bed.  She expressed concern that the oral medications will not help control her abdominal pain, as she uses them for her back, neck, and tailbone pain.  She is tolerating a diet without nausea and vomiting.  She is passing lots of flatus, and had a bowel movement yesterday.  Objective: Vital signs in last 24 hours: Temp:  [98.3 F (36.8 C)-98.6 F (37 C)] 98.3 F (36.8 C) (08/17 0435) Pulse Rate:  [96-108] 96 (08/17 0435) Resp:  [18-20] 20 (08/17 0435) BP: (135-150)/(82-95) 141/90 (08/17 0903) SpO2:  [95 %-96 %] 96 % (08/17 0435) Last BM Date : 02/10/23  Intake/Output from previous day: 08/16 0701 - 08/17 0700 In: 6269.9 [I.V.:6269.9] Out: 2520 [Urine:2400; Drains:120] Intake/Output this shift: Total I/O In: 240 [P.O.:240] Out: 910 [Urine:800; Drains:110]  General appearance: alert, cooperative, and no distress GI: Abdomen soft, nondistended, no percussion tenderness, nontender to palpation; no rigidity, guarding, rebound tenderness; midline incision C/D/I with honeycomb dressing in place, JP drain in right lower quadrant with 230 cc of serous output in the last 24 hours  Lab Results:  Recent Labs    02/10/23 0428 02/11/23 0453  WBC 18.5* 19.1*  HGB 10.9* 10.2*  HCT 33.2* 31.0*  PLT 596* 568*   BMET Recent Labs    02/10/23 0428 02/11/23 0453  NA 133* 133*  K 3.2* 3.6  CL 98 101  CO2 27 26  GLUCOSE 120* 118*  BUN 6 6  CREATININE 0.53 0.53  CALCIUM 7.9* 7.7*   PT/INR No results for input(s): "LABPROT", "INR" in the last 72 hours.  Studies/Results: No results found.  Anti-infectives: Anti-infectives (From admission, onward)    Start     Dose/Rate Route Frequency Ordered Stop   02/05/23 1400  piperacillin-tazobactam (ZOSYN) IVPB 3.375 g        3.375 g 12.5 mL/hr over 240 Minutes Intravenous Every 8 hours 02/05/23 1350  02/10/23 0827   02/05/23 0515  piperacillin-tazobactam (ZOSYN) IVPB 3.375 g        3.375 g 100 mL/hr over 30 Minutes Intravenous  Once 02/05/23 0509 02/05/23 1660       Assessment/Plan:  Patient is a 42 year old female who is status post exploratory laparotomy with appendectomy on 8/11.  -Patient with mild increase in leukocytosis today, 19.1 from 18.5.  Will continue to monitor with daily labs -Continue regular diet -Continue bowel regimen -I explained to the patient that our goal is to discharge her from the hospital.  I have discontinued the IV Dilaudid that she was receiving every 2 hours.  I have changed her oxycodone to the dose and frequency that she receives as an outpatient, and I also ordered her home Flexeril which is scheduled.  We discussed that given that she has a pain contract, I will be unable to prescribe any narcotics at the time of discharge.  For this reason, I explained that it is important that we try her home regimen to see how her pain is controlled -I have also changed her Tylenol to scheduled -Will see how the patient's pain is controlled -Monitor for bowel function -Continue JP drain care.  Will maintain at this time given 230 cc of output in the last 24 hours -SCDs and Lovenox -Hopeful for discharge in the next 24 to 48 hours   LOS: 6 days    Nysir Fergusson A Hina Gupta 02/11/2023

## 2023-02-11 NOTE — Plan of Care (Signed)

## 2023-02-12 LAB — CBC
HCT: 29.5 % — ABNORMAL LOW (ref 36.0–46.0)
Hemoglobin: 9.6 g/dL — ABNORMAL LOW (ref 12.0–15.0)
MCH: 29 pg (ref 26.0–34.0)
MCHC: 32.5 g/dL (ref 30.0–36.0)
MCV: 89.1 fL (ref 80.0–100.0)
Platelets: 593 10*3/uL — ABNORMAL HIGH (ref 150–400)
RBC: 3.31 MIL/uL — ABNORMAL LOW (ref 3.87–5.11)
RDW: 16 % — ABNORMAL HIGH (ref 11.5–15.5)
WBC: 16.3 10*3/uL — ABNORMAL HIGH (ref 4.0–10.5)
nRBC: 0 % (ref 0.0–0.2)

## 2023-02-12 MED ORDER — OXYCODONE HCL 5 MG PO TABS
15.0000 mg | ORAL_TABLET | Freq: Four times a day (QID) | ORAL | Status: DC | PRN
  Administered 2023-02-12: 15 mg via ORAL
  Filled 2023-02-12 (×2): qty 3

## 2023-02-12 MED ORDER — OXYCODONE HCL 5 MG PO TABS
10.0000 mg | ORAL_TABLET | ORAL | Status: DC | PRN
  Administered 2023-02-12 – 2023-02-13 (×4): 10 mg via ORAL
  Filled 2023-02-12 (×4): qty 2

## 2023-02-12 NOTE — Plan of Care (Signed)
  Problem: Nutrition: Goal: Adequate nutrition will be maintained Outcome: Progressing   Problem: Elimination: Goal: Will not experience complications related to bowel motility Outcome: Progressing Goal: Will not experience complications related to urinary retention Outcome: Progressing   Problem: Skin Integrity: Goal: Risk for impaired skin integrity will decrease Outcome: Progressing   

## 2023-02-12 NOTE — Progress Notes (Signed)
[facility] Progress Note  7 Days Post-Op  Subjective: Patient seen and examined.  She is resting comfortably in bed.  She complains that with no IV narcotics, she feels she cannot get out of bed secondary to uncontrolled pain.  She is tolerating a diet without nausea and vomiting.  She is passing flatus, but denies any bowel movements in the last 24 hours.  Her JP drain remains in place with 110 cc of serous output in the last 24 hours.  Objective: Vital signs in last 24 hours: Temp:  [97.8 F (36.6 C)-98.7 F (37.1 C)] 97.8 F (36.6 C) (08/18 0343) Pulse Rate:  [90-109] 90 (08/18 0343) Resp:  [18-20] 20 (08/18 0343) BP: (124-144)/(75-88) 130/78 (08/18 0343) SpO2:  [93 %-98 %] 96 % (08/18 0343) Last BM Date : 02/10/23  Intake/Output from previous day: 08/17 0701 - 08/18 0700 In: 480 [P.O.:480] Out: 3800 [Urine:3600; Drains:110; Blood:90] Intake/Output this shift: Total I/O In: -  Out: 900 [Urine:900]  General appearance: alert, cooperative, and no distress GI: Abdomen soft, nondistended, no percussion tenderness, diffuse tenderness to palpation; no rigidity, guarding, rebound tenderness; midline incision C/D/I with skin staples and honeycomb dressing in place, JP drain in right lower quadrant with serous output in bulb  Lab Results:  Recent Labs    02/11/23 0453 02/12/23 0352  WBC 19.1* 16.3*  HGB 10.2* 9.6*  HCT 31.0* 29.5*  PLT 568* 593*   BMET Recent Labs    02/10/23 0428 02/11/23 0453  NA 133* 133*  K 3.2* 3.6  CL 98 101  CO2 27 26  GLUCOSE 120* 118*  BUN 6 6  CREATININE 0.53 0.53  CALCIUM 7.9* 7.7*   PT/INR No results for input(s): "LABPROT", "INR" in the last 72 hours.  Studies/Results: No results found.  Anti-infectives: Anti-infectives (From admission, onward)    Start     Dose/Rate Route Frequency Ordered Stop   02/05/23 1400  piperacillin-tazobactam (ZOSYN) IVPB 3.375 g        3.375 g 12.5 mL/hr over 240 Minutes  Intravenous Every 8 hours 02/05/23 1350 02/10/23 0827   02/05/23 0515  piperacillin-tazobactam (ZOSYN) IVPB 3.375 g        3.375 g 100 mL/hr over 30 Minutes Intravenous  Once 02/05/23 0509 02/05/23 8295       Assessment/Plan:  Patient is a 42 year old female who is status post exploratory laparotomy with appendectomy on 8/11.   -Patient's leukocytosis is improving today, 16.3 from 19.1 -Continue regular diet -Continue bowel regimen -I had an extensive discussion with the patient about trying to get her pain controlled without using IV narcotics, as she cannot be discharged home on IV narcotics.  I will increase her oxycodone from 10 mg every 6hrs to 15 mg every 6hrs and see how this does with controlling her pain.  In addition to this medication, she is on scheduled Tylenol, Flexeril, and Toradol -Monitor for bowel function -Continue JP drain care.  Will maintain at this time given 110 cc of output in the last 24 hours -SCDs and Lovenox   LOS: 7 days    Mia Waters A Mia Waters 02/12/2023

## 2023-02-13 MED ORDER — LORAZEPAM 1 MG PO TABS: 1.0000 mg | ORAL_TABLET | Freq: Three times a day (TID) | ORAL | 0 refills | Status: DC | PRN

## 2023-02-13 MED ORDER — OXYCODONE HCL 10 MG PO TABS: 10.0000 mg | ORAL_TABLET | Freq: Four times a day (QID) | ORAL | 0 refills | Status: DC

## 2023-02-13 NOTE — Discharge Summary (Signed)
Physician Discharge Summary  Patient ID: Mia Waters MRN: [id number] DOB/AGE: November 30, 1980 42 y.o.  Admit date: 02/05/2023 Discharge date: 02/13/2023  Admission Diagnoses: Perforated viscus  Discharge Diagnoses:  Principal Problem:   Perforated appendicitis Active Problems:   Pneumoperitoneum Anxiety, chronic pain, morbid obesity  Discharged Condition: good  Hospital Course: Patient is a 42 year old white female who presented to the emergency room on 02/05/2023 with worsening abdominal pain.  CT scan of the abdomen revealed pneumoperitoneum with possible perforated viscus.  She was taken emergently to the operating room on 02/05/2023 and underwent exploratory laparotomy, appendectomy.  She was found to have a perforated appendix.  She tolerated the surgery well.  Her postoperative course has been remarkable for a mild leukocytosis.  Intraoperative cultures revealed E. coli and strep.  Pain management was difficult due to her chronic pain syndrome requiring oxycodone.  She did have a postoperative ileus which subsequently resolved.  Patient was very anxious throughout her stay.  Her diet was advanced out difficulty.  She is being discharged home on 02/13/2023 in good and improving condition.   Treatments: surgery: Exploratory laparotomy, open appendectomy on 02/05/2023  Discharge Exam: Blood pressure 134/79, pulse 91, temperature 97.9 F (36.6 C), resp. rate 20, height 5\' 8"  (1.727 m), weight 115 kg, SpO2 96%, unknown if currently breastfeeding. General appearance: alert, cooperative, and no distress Resp: clear to auscultation bilaterally Cardio: regular rate and rhythm, S1, S2 normal, no murmur, click, rub or gallop GI: Soft, incision healing well.  Staples removed, Steri-Strips applied.  JP drain removed.  Disposition: Discharge disposition: 01-Home or Self Care       Discharge Instructions     Diet - low sodium heart healthy   Complete by: As directed    Increase activity  slowly   Complete by: As directed       Allergies as of 02/13/2023   No Known Allergies      Medication List     TAKE these medications    amLODipine 10 MG tablet Commonly known as: NORVASC Take 10 mg by mouth at bedtime.   buPROPion 150 MG 12 hr tablet Commonly known as: WELLBUTRIN SR Take 150 mg by mouth 2 (two) times daily.   cyclobenzaprine 10 MG tablet Commonly known as: FLEXERIL Take 10 mg by mouth 3 (three) times daily.   LORazepam 1 MG tablet Commonly known as: ATIVAN Take 1 tablet (1 mg total) by mouth every 8 (eight) hours as needed for anxiety.   losartan-hydrochlorothiazide 100-25 MG tablet Commonly known as: HYZAAR Take 1 tablet by mouth daily.   ondansetron 4 MG tablet Commonly known as: ZOFRAN Take 4 mg by mouth every 8 (eight) hours as needed.   Oxycodone HCl 10 MG Tabs Take 1 tablet (10 mg total) by mouth every 6 (six) hours.   polyethylene glycol 17 g packet Commonly known as: MIRALAX / GLYCOLAX Take 17 g by mouth in the morning.   pregabalin 150 MG capsule Commonly known as: LYRICA Take 150 mg by mouth every 8 (eight) hours.   Psyllium 25 % Powd Take 10 mg by mouth daily.        Follow-up Information     Franky Macho, MD Follow up.   Specialty: General Surgery Why: As needed Contact information: [address] [place] [address] [phone]                 Signed: Franky Macho 02/13/2023, 8:04 AM

## 2023-02-13 NOTE — Plan of Care (Signed)
  Problem: Pain Managment: Goal: General experience of comfort will improve Outcome: Progressing   

## 2023-02-16 NOTE — Progress Notes (Signed)
Found Mia Waters laying flat in her hospital bed with eyes open. She welcomed Chaplain warmly and engaged in reflection around her illness and the difficulty of having open abdominal surgery. She shared how difficult this is due to her being away from her kids and also how this is an inopportune time to have this due to their upcoming school schedule beginning. Chaplain provided space for her to reflect and prayer bedside., No further needs noted at this time.    Rev. Jolyn Lent, M.Div Chaplain

## 2023-02-16 NOTE — Progress Notes (Deleted)
   02/08/23 1350  Spiritual Encounters  Type of Visit Follow up  Care provided to: Patient  Referral source Patient request  Reason for visit Routine spiritual support  OnCall Visit No  Spiritual Framework  Presenting Themes Meaning/purpose/sources of inspiration;Goals in life/care;Courage hope and growth;Significant life change  Community/Connection Family;Significant other  Patient Stress Factors Health changes  Family Stress Factors None identified  Interventions  Spiritual Care Interventions Made Compassionate presence;Reflective listening;Normalization of emotions;Prayer;Encouragement  Intervention Outcomes  Outcomes Connection to spiritual care;Autonomy/agency;Awareness of support  Spiritual Care Plan  Spiritual Care Issues Still Outstanding No further spiritual care needs at this time (see row info)   Found Ms. Bunt laying flat in her hospital bed with eyes open. She welcomed Chaplain warmly and engaged in reflection around her illness and the difficulty of having open abdominal surgery. She shared how difficult this is due to her being away from her kids and also how this is an inopportune time to have this due to their upcoming school schedule beginning. Chaplain provided space for her to reflect and prayer bedside., No further needs noted at this time.   Rev. Jolyn Lent, M.Div Chaplain

## 2023-02-19 ENCOUNTER — Emergency Department (HOSPITAL_COMMUNITY)
Admission: EM | Admit: 2023-02-19 | Discharge: 2023-02-19 | Disposition: A | Discharge status: Home / Self Care | Payer: Medicaid Other | Attending: Emergency Medicine | Admitting: Emergency Medicine

## 2023-02-19 ENCOUNTER — Emergency Department (HOSPITAL_COMMUNITY): Payer: Medicaid Other

## 2023-02-19 ENCOUNTER — Other Ambulatory Visit: Payer: Self-pay

## 2023-02-19 DIAGNOSIS — Z79899 Other long term (current) drug therapy: Secondary | ICD-10-CM | POA: Diagnosis not present

## 2023-02-19 DIAGNOSIS — I10 Essential (primary) hypertension: Secondary | ICD-10-CM | POA: Insufficient documentation

## 2023-02-19 DIAGNOSIS — R109 Unspecified abdominal pain: Secondary | ICD-10-CM | POA: Insufficient documentation

## 2023-02-19 DIAGNOSIS — R000 Tachycardia, unspecified: Secondary | ICD-10-CM | POA: Insufficient documentation

## 2023-02-19 DIAGNOSIS — T8149XA Infection following a procedure, other surgical site, initial encounter: Secondary | ICD-10-CM | POA: Diagnosis present

## 2023-02-19 LAB — CBC WITH DIFFERENTIAL/PLATELET
Abs Immature Granulocytes: 0.04 10*3/uL (ref 0.00–0.07)
Basophils Absolute: 0.1 10*3/uL (ref 0.0–0.1)
Basophils Relative: 1 %
Eosinophils Absolute: 0.3 10*3/uL (ref 0.0–0.5)
Eosinophils Relative: 3 %
HCT: 30.9 % — ABNORMAL LOW (ref 36.0–46.0)
Hemoglobin: 10.1 g/dL — ABNORMAL LOW (ref 12.0–15.0)
Immature Granulocytes: 0 %
Lymphocytes Relative: 32 %
Lymphs Abs: 4.2 10*3/uL — ABNORMAL HIGH (ref 0.7–4.0)
MCH: 28.9 pg (ref 26.0–34.0)
MCHC: 32.7 g/dL (ref 30.0–36.0)
MCV: 88.5 fL (ref 80.0–100.0)
Monocytes Absolute: 0.9 10*3/uL (ref 0.1–1.0)
Monocytes Relative: 7 %
Neutro Abs: 7.6 10*3/uL (ref 1.7–7.7)
Neutrophils Relative %: 57 %
Platelets: 685 10*3/uL — ABNORMAL HIGH (ref 150–400)
RBC: 3.49 MIL/uL — ABNORMAL LOW (ref 3.87–5.11)
RDW: 15.3 % (ref 11.5–15.5)
WBC: 13.2 10*3/uL — ABNORMAL HIGH (ref 4.0–10.5)
nRBC: 0 % (ref 0.0–0.2)

## 2023-02-19 LAB — BASIC METABOLIC PANEL
Anion gap: 10 (ref 5–15)
BUN: 9 mg/dL (ref 6–20)
CO2: 22 mmol/L (ref 22–32)
Calcium: 8.7 mg/dL — ABNORMAL LOW (ref 8.9–10.3)
Chloride: 102 mmol/L (ref 98–111)
Creatinine, Ser: 0.66 mg/dL (ref 0.44–1.00)
GFR, Estimated: >60 mL/min (ref >60)
Glucose, Bld: 95 mg/dL (ref 70–99)
Potassium: 3.6 mmol/L (ref 3.5–5.1)
Sodium: 134 mmol/L — ABNORMAL LOW (ref 135–145)

## 2023-02-19 MED ORDER — SODIUM CHLORIDE 0.9 % IV SOLN
1.0000 g | Freq: Once | INTRAVENOUS | Status: AC
  Administered 2023-02-19: 1 g via INTRAVENOUS
  Filled 2023-02-19: qty 10

## 2023-02-19 MED ORDER — ONDANSETRON 4 MG PO TBDP: 4.0000 mg | ORAL_TABLET | Freq: Three times a day (TID) | ORAL | 0 refills | Status: DC | PRN

## 2023-02-19 MED ORDER — ONDANSETRON HCL 4 MG/2ML IJ SOLN
4.0000 mg | Freq: Once | INTRAMUSCULAR | Status: AC
  Administered 2023-02-19: 4 mg via INTRAVENOUS
  Filled 2023-02-19: qty 2

## 2023-02-19 MED ORDER — DOXYCYCLINE HYCLATE 100 MG PO CAPS: 100.0000 mg | ORAL_CAPSULE | Freq: Two times a day (BID) | ORAL | 0 refills | Status: DC

## 2023-02-19 MED ORDER — IOHEXOL 300 MG/ML  SOLN
100.0000 mL | Freq: Once | INTRAMUSCULAR | Status: AC | PRN
  Administered 2023-02-19: 100 mL via INTRAVENOUS

## 2023-02-19 MED ORDER — FENTANYL CITRATE PF 50 MCG/ML IJ SOSY
50.0000 ug | PREFILLED_SYRINGE | Freq: Once | INTRAMUSCULAR | Status: AC
  Administered 2023-02-19: 50 ug via INTRAVENOUS
  Filled 2023-02-19: qty 1

## 2023-02-19 NOTE — Discharge Instructions (Signed)
Thankfully your testing shows no signs of internal infections, this appears to only be a small skin infection at the area of the incision.  You should take doxycycline twice a day for the next 10 days, you can take ibuprofen or Tylenol for pain.  I have prescribed Zofran for nausea.  ER for severe worsening pain fever vomiting or other worsening symptoms otherwise see Dr. Lovell Sheehan in the office this week.

## 2023-02-19 NOTE — ED Provider Notes (Signed)
[place] EMERGENCY DEPARTMENT AT [facility] Provider Note   CSN: [id number] Arrival date & time: 02/19/23  1906     History  Chief Complaint  Patient presents with   Abdominal Pain    Mia Waters is a 42 y.o. female.  HPI   42 year old female, she has a history of hypertension on amlodipine losartan and hydrochlorothiazide, she had a recent perforated viscus thought to be possibly related to perforated appendicitis, required an exploratory laparotomy.  The patient states that the top part of the wound had some small amount of dehiscence and now she is having a purulent material with some abdominal discomfort.  She has no fevers or chills, no nausea or vomiting.  Home Medications Prior to Admission medications   Medication Sig Start Date End Date Taking? Authorizing Provider  doxycycline (VIBRAMYCIN) 100 MG capsule Take 1 capsule (100 mg total) by mouth 2 (two) times daily. 02/19/23  Yes Eber Hong, MD  ondansetron (ZOFRAN-ODT) 4 MG disintegrating tablet Take 1 tablet (4 mg total) by mouth every 8 (eight) hours as needed for nausea. 02/19/23  Yes Eber Hong, MD  amLODipine (NORVASC) 10 MG tablet Take 10 mg by mouth at bedtime. 01/13/23   [provider]  buPROPion (WELLBUTRIN SR) 150 MG 12 hr tablet Take 150 mg by mouth 2 (two) times daily. 12/30/22   [provider]  cyclobenzaprine (FLEXERIL) 10 MG tablet Take 10 mg by mouth 3 (three) times daily. 02/01/23   [provider]  LORazepam (ATIVAN) 1 MG tablet Take 1 tablet (1 mg total) by mouth every 8 (eight) hours as needed for anxiety. 02/13/23   Franky Macho, MD  losartan-hydrochlorothiazide (HYZAAR) 100-25 MG tablet Take 1 tablet by mouth daily. 01/13/23   [provider]  ondansetron (ZOFRAN) 4 MG tablet Take 4 mg by mouth every 8 (eight) hours as needed. 01/13/23   [provider]  Oxycodone HCl 10 MG TABS Take 1 tablet (10 mg total) by mouth every 6 (six) hours. 02/13/23    Franky Macho, MD  polyethylene glycol (MIRALAX / GLYCOLAX) 17 g packet Take 17 g by mouth in the morning. 09/02/22   [provider]  pregabalin (LYRICA) 150 MG capsule Take 150 mg by mouth every 8 (eight) hours. 01/24/23   [provider]  Psyllium 25 % POWD Take 10 mg by mouth daily.    [provider]      Allergies    Patient has no known allergies.    Review of Systems   Review of Systems  All other systems reviewed and are negative.   Physical Exam Updated Vital Signs BP 130/84   Pulse 85   Temp 98.7 F (37.1 C) (Oral)   Resp 18   Ht 1.727 m (5\' 8" )   Wt 101.6 kg   LMP 02/06/2023 (Exact Date)   SpO2 97%   BMI 34.06 kg/m  Physical Exam Vitals and nursing note reviewed.  Constitutional:      General: She is not in acute distress.    Appearance: She is well-developed.  HENT:     Head: Normocephalic and atraumatic.     Mouth/Throat:     Pharynx: No oropharyngeal exudate.  Eyes:     General: No scleral icterus.       Right eye: No discharge.        Left eye: No discharge.     Conjunctiva/sclera: Conjunctivae normal.     Pupils: Pupils are equal, round, and reactive to  light.  Neck:     Thyroid: No thyromegaly.     Vascular: No JVD.  Cardiovascular:     Rate and Rhythm: Regular rhythm. Tachycardia present.     Heart sounds: Normal heart sounds. No murmur heard.    No friction rub. No gallop.     Comments: Heart rate of about 105 on my exam, normal pulses Pulmonary:     Effort: Pulmonary effort is normal. No respiratory distress.     Breath sounds: Normal breath sounds. No wheezing or rales.  Abdominal:     General: Bowel sounds are normal. There is no distension.     Palpations: Abdomen is soft. There is no mass.     Tenderness: There is abdominal tenderness.     Comments: Mild tenderness mid abdomen, upper superior aspect of laparotomy scar is dehisced, approximately 1 cm open, probed with sterile Q-tip, purulent and bloody material  removed and culture sent  Musculoskeletal:        General: No tenderness. Normal range of motion.     Cervical back: Normal range of motion and neck supple.     Right lower leg: No edema.     Left lower leg: No edema.  Lymphadenopathy:     Cervical: No cervical adenopathy.  Skin:    General: Skin is warm and dry.     Findings: No erythema or rash.  Neurological:     Mental Status: She is alert.     Coordination: Coordination normal.  Psychiatric:        Behavior: Behavior normal.     ED Results / Procedures / Treatments   Labs (all labs ordered are listed, but only abnormal results are displayed) Labs Reviewed  BASIC METABOLIC PANEL - Abnormal; Notable for the following components:      Result Value   Sodium 134 (*)    Calcium 8.7 (*)    All other components within normal limits  CBC WITH DIFFERENTIAL/PLATELET - Abnormal; Notable for the following components:   WBC 13.2 (*)    RBC 3.49 (*)    Hemoglobin 10.1 (*)    HCT 30.9 (*)    Platelets 685 (*)    Lymphs Abs 4.2 (*)    All other components within normal limits  AEROBIC/ANAEROBIC CULTURE W GRAM STAIN (SURGICAL/DEEP WOUND)  CBC WITH DIFFERENTIAL/PLATELET    EKG None  Radiology CT ABDOMEN PELVIS W CONTRAST  Result Date: 02/19/2023 CLINICAL DATA:  Abdominal pain, post-op recent lap bowel perf 02/05/23, pain and drainage - pus Surgery 02/13/2023 for perforated bowel in appendectomy. Open wound with drainage. EXAM: CT ABDOMEN AND PELVIS WITH CONTRAST TECHNIQUE: Multidetector CT imaging of the abdomen and pelvis was performed using the standard protocol following bolus administration of intravenous contrast. RADIATION DOSE REDUCTION: This exam was performed according to the departmental dose-optimization program which includes automated exposure control, adjustment of the mA and/or kV according to patient size and/or use of iterative reconstruction technique. CONTRAST:  OMNIPAQUE IOHEXOL 300 MG/ML  SOLN COMPARISON:  CT  02/09/2023 FINDINGS: Lower chest: Trace right pleural effusion. Mild lower lobe atelectasis. Hepatobiliary: No focal liver abnormality is seen. No gallstones, gallbladder wall thickening, or biliary dilatation. Pancreas: No ductal dilatation or inflammation. Spleen: Normal in size without focal abnormality. Adrenals/Urinary Tract: No adrenal nodule. No hydronephrosis, renal calculi or renal inflammation. Unremarkable urinary bladder. Stomach/Bowel: Bowel assessment is limited in the absence of enteric contrast. Minimal hiatal hernia. The stomach is physiologically distended. Persistent fluid-filled prominent loops of small bowel centrally,  however improvement from prior exam. Greatest small bowel distension 3.2 cm centrally. Some of the small bowel loops appear opposed to the anterior abdominal wall. Appendectomy. Moderate colonic stool burden. Vascular/Lymphatic: No acute vascular findings. Patent portal and mesenteric veins. Normal caliber abdominal aorta. Scattered prominent central small bowel lymph nodes. Reproductive: Uterus and bilateral adnexa are unremarkable. Other: Midline abdominal wound consistent with recent surgery. Soft tissue thickening and small amount of gas in the subcutaneous tissues at site of wound. Small amount of fluid just above the umbilicus in the midline measuring 2.6 x 1.6 x 4.1 cm, series 2, image 55, no definite peripheral enhancement. Removal of right lower quadrant drain. No fluid collection at site of drain. Resolved pelvic free fluid. No intra-abdominopelvic collection. No free intra-abdominal air. Small fat containing bilateral inguinal hernias. Musculoskeletal: Facet hypertrophy at L4-L5. There are no acute or suspicious osseous abnormalities. No intramuscular collection. IMPRESSION: 1. Midline abdominal wound consistent with recent surgery. Soft tissue thickening and small amount of gas in the subcutaneous tissues at site of wound. Small amount of ill-defined fluid just above  the umbilicus in the midline measuring 2.6 x 1.6 x 4.1 cm, no definite peripheral enhancement. Sterility is indeterminate by imaging. 2. Persistent but improving fluid-filled prominent loops of small bowel centrally. Suspect improving postoperative ileus. 3. Trace right pleural effusion. Mild lower lobe atelectasis. Electronically Signed   By: Narda Rutherford M.D.   On: 02/19/2023 22:08    Procedures Procedures    Medications Ordered in ED Medications  cefTRIAXone (ROCEPHIN) 1 g in sodium chloride 0.9 % 100 mL IVPB (0 g Intravenous Stopped 02/19/23 2143)  fentaNYL (SUBLIMAZE) injection 50 mcg (50 mcg Intravenous Given 02/19/23 2118)  iohexol (OMNIPAQUE) 300 MG/ML solution 100 mL (100 mLs Intravenous Contrast Given 02/19/23 2128)  ondansetron (ZOFRAN) injection 4 mg (4 mg Intravenous Given 02/19/23 2142)    ED Course/ Medical Decision Making/ A&P                                 Medical Decision Making Amount and/or Complexity of Data Reviewed Labs: ordered. Radiology: ordered.  Risk Prescription drug management.   The patient is not in distress but mildly tachycardic, will proceed with CT scan of the abdomen and pelvis and culture of the drainage.  The patient is agreeable.  Dr. Lovell Sheehan aware and agreeable to the plan as well.  Labs: Patient does have leukocytosis, this is better than it was on the 18th, hemoglobin is about stable compared to prior, metabolic panel without renal dysfunction whatsoever.  CT scan of the abdomen and pelvis reveals that there is some skin and soft tissue infection but no signs of deep tissue infection.  There is no signs of intra-abdominal pathology, the patient was informed of these results and is agreeable to the plan of doxycycline at home.  This patient is stable with regards to vital signs, tolerating p.o., she will be given Zofran as needed for nausea and antibiotics for home.        Final Clinical Impression(s) / ED Diagnoses Final diagnoses:   Wound infection after surgery    Rx / DC Orders ED Discharge Orders          Ordered    doxycycline (VIBRAMYCIN) 100 MG capsule  2 times daily        02/19/23 2230    ondansetron (ZOFRAN-ODT) 4 MG disintegrating tablet  Every 8 hours PRN  02/19/23 2230              Eber Hong, MD 02/19/23 2232

## 2023-02-19 NOTE — ED Notes (Signed)
Pt ambulated to restroom. 

## 2023-02-19 NOTE — ED Triage Notes (Signed)
Surgery on the 02/13/2023, had surgery for perforated bowel and appendectomy. Upon discharge on 08/19, wound was open. Was draining bright red, now gray - white with foul order. Top of central abdominal incision open (approximately 3/4") with purulent drainage noted on the dressing and the wound.

## 2023-02-20 ENCOUNTER — Telehealth: Payer: Self-pay | Admitting: *Deleted

## 2023-02-20 NOTE — Telephone Encounter (Signed)
Discussed with provider.   Agreeable to refilling prescription. Advised that patient will need to move up pain clinic appointment for them to resume prescribing medication.   Call placed to patient. LMTRC.

## 2023-02-20 NOTE — Telephone Encounter (Signed)
Call placed to patient and appointment scheduled.  

## 2023-02-20 NOTE — Telephone Encounter (Signed)
Surgical Date: 028/04/2023 Procedure: Ex Lap, Appendectomy  Received VM from patient after office had closed on 02/17/2023~ [phone]~ telephone.   Patient reported increased drainage from incision and pain.  Noted that patient was seen in ED for post- op infection.   Attempted to call patient to schedule follow up with Dr. Lovell Sheehan. LMTRC.

## 2023-02-20 NOTE — Telephone Encounter (Signed)
Patient returned call and made aware.   Patient also states that her next appointment with [facility] is 03/02/2023. States that she will try to get appointment moved up.

## 2023-02-20 NOTE — Telephone Encounter (Signed)
Surgical Date: 028/04/2023 Procedure: Ex Lap, Appendectomy   Received call from patient~ [phone]~ telephone.   Patient reports that she is under the care of [facility] in [place], [place]. States that she is usually prescribed Oxycodone 10 mg PO QID. States that her last prescription from [facility] was 01/06/2023. She was scheduled to follow up with pain clinic on 02/06/2023, but was hospitalized at that time.   Reports that pain clinic will not prescribe any further pain medication until patient is seen in clinic.   Requested provider refill pain medication at this time. Last prescription for Oxycodone 10 mg from Dr. Lovell Sheehan given on 02/13/2023, #30 tabs.

## 2023-02-21 ENCOUNTER — Other Ambulatory Visit (INDEPENDENT_AMBULATORY_CARE_PROVIDER_SITE_OTHER): Payer: Medicaid Other | Admitting: General Surgery

## 2023-02-21 DIAGNOSIS — Z09 Encounter for follow-up examination after completed treatment for conditions other than malignant neoplasm: Secondary | ICD-10-CM

## 2023-02-21 MED ORDER — OXYCODONE HCL 10 MG PO TABS: 10.0000 mg | ORAL_TABLET | Freq: Four times a day (QID) | ORAL | 0 refills | Status: DC

## 2023-02-21 NOTE — Progress Notes (Signed)
Reordered Oxycodone as patient unable to see Pain Clinic

## 2023-02-23 LAB — AEROBIC/ANAEROBIC CULTURE W GRAM STAIN (SURGICAL/DEEP WOUND)

## 2023-02-24 ENCOUNTER — Telehealth (HOSPITAL_BASED_OUTPATIENT_CLINIC_OR_DEPARTMENT_OTHER): Payer: Self-pay

## 2023-02-24 NOTE — Progress Notes (Signed)
ED Antimicrobial Stewardship Positive Culture Follow Up   Mia Waters is an 42 y.o. female who presented to [facility] on 02/19/2023 with a chief complaint of  Chief Complaint  Patient presents with   Abdominal Pain    Recent Results (from the past 720 hour(s))  SARS Coronavirus 2 by RT PCR (hospital order, performed in [facility] hospital lab) *cepheid single result test* Anterior Nasal Swab     Status: None   Collection Time: 02/05/23  3:42 AM   Specimen: Anterior Nasal Swab  Result Value Ref Range Status   SARS Coronavirus 2 by RT PCR NEGATIVE NEGATIVE Final    Comment: (NOTE) SARS-CoV-2 target nucleic acids are NOT DETECTED.  The SARS-CoV-2 RNA is generally detectable in upper and lower respiratory specimens during the acute phase of infection. The lowest concentration of SARS-CoV-2 viral copies this assay can detect is 250 copies / mL. A negative result does not preclude SARS-CoV-2 infection and should not be used as the sole basis for treatment or other patient management decisions.  A negative result may occur with improper specimen collection / handling, submission of specimen other than nasopharyngeal swab, presence of viral mutation(s) within the areas targeted by this assay, and inadequate number of viral copies (<250 copies / mL). A negative result must be combined with clinical observations, patient history, and epidemiological information.  Fact Sheet for Patients:   RoadLapTop.co.za  Fact Sheet for Healthcare Providers: http://kim-miller.com/  This test is not yet approved or  cleared by the [place] FDA and has been authorized for detection and/or diagnosis of SARS-CoV-2 by FDA under an Emergency Use Authorization (EUA).  This EUA will remain in effect (meaning this test can be used) for the duration of the COVID-19 declaration under Section 564(b)(1) of the Act, 21 U.S.C. section 360bbb-3(b)(1), unless the  authorization is terminated or revoked sooner.  Performed at [facility], [address] [place], [place] [address]   Blood Culture (routine x 2)     Status: None   Collection Time: 02/05/23  6:15 AM   Specimen: Left Antecubital; Blood  Result Value Ref Range Status   Specimen Description   Final    LEFT ANTECUBITAL BOTTLES DRAWN AEROBIC AND ANAEROBIC   Special Requests Blood Culture adequate volume  Final   Culture   Final    NO GROWTH 5 DAYS Performed at [facility], [address], [place], [place] [address]    Report Status 02/10/2023 FINAL  Final  Blood Culture (routine x 2)     Status: None   Collection Time: 02/05/23  6:15 AM   Specimen: BLOOD LEFT ARM  Result Value Ref Range Status   Specimen Description BLOOD LEFT ARM BOTTLES DRAWN AEROBIC AND ANAEROBIC  Final   Special Requests Blood Culture adequate volume  Final   Culture   Final    NO GROWTH 5 DAYS Performed at [facility], [address] [place], [place] [address]    Report Status 02/10/2023 FINAL  Final  Aerobic/Anaerobic Culture w Gram Stain (surgical/deep wound)     Status: None   Collection Time: 02/05/23 12:38 PM   Specimen: PATH Cytology Peritoneal fluid; Body Fluid  Result Value Ref Range Status   Specimen Description PERITONEAL FLUID  Final   Special Requests ID A E SWAB  Final   Gram Stain   Final    FEW WBC PRESENT, PREDOMINANTLY PMN MODERATE GRAM POSITIVE COCCI MODERATE GRAM NEGATIVE RODS RARE GRAM POSITIVE RODS    Culture  Final    MODERATE ESCHERICHIA COLI MODERATE STREPTOCOCCUS ANGINOSIS ABUNDANT BACTEROIDES THETAIOTAOMICRON ABUNDANT PARABACTEROIDES DISTASONIS BETA LACTAMASE POSITIVE Performed at [facility], [address] [place], [place] [address]    Report Status 02/08/2023 FINAL  Final   Organism ID, Bacteria ESCHERICHIA COLI  Final   Organism ID, Bacteria STREPTOCOCCUS ANGINOSIS  Final      Susceptibility   Escherichia coli - MIC*    AMPICILLIN >=32 RESISTANT Resistant      CEFEPIME <=0.12 SENSITIVE Sensitive     CEFTAZIDIME <=1 SENSITIVE Sensitive     CEFTRIAXONE <=0.25 SENSITIVE Sensitive     CIPROFLOXACIN <=0.25 SENSITIVE Sensitive     GENTAMICIN <=1 SENSITIVE Sensitive     IMIPENEM <=0.25 SENSITIVE Sensitive     TRIMETH/SULFA <=20 SENSITIVE Sensitive     AMPICILLIN/SULBACTAM >=32 RESISTANT Resistant     PIP/TAZO <=4 SENSITIVE Sensitive     * MODERATE ESCHERICHIA COLI   Streptococcus anginosis - MIC*    PENICILLIN <=0.06 SENSITIVE Sensitive     CEFTRIAXONE 0.25 SENSITIVE Sensitive     ERYTHROMYCIN >=8 RESISTANT Resistant     LEVOFLOXACIN <=0.25 SENSITIVE Sensitive     VANCOMYCIN 0.25 SENSITIVE Sensitive     * MODERATE STREPTOCOCCUS ANGINOSIS  MRSA Next Gen by PCR, Nasal     Status: Abnormal   Collection Time: 02/05/23  3:00 PM   Specimen: Nasal Mucosa; Nasal Swab  Result Value Ref Range Status   MRSA by PCR Next Gen DETECTED (A) NOT DETECTED Final    Comment: RESULT CALLED TO, READ BACK BY AND VERIFIED WITH: A SHELTON AT 1453 ON [id number] BY S DALTON (NOTE) The GeneXpert MRSA Assay (FDA approved for NASAL specimens only), is one component of a comprehensive MRSA colonization surveillance program. It is not intended to diagnose MRSA infection nor to guide or monitor treatment for MRSA infections. Test performance is not FDA approved in patients less than 57 years old. Performed at [facility], [address], [place], [place] [address]   Aerobic/Anaerobic Culture w Gram Stain (surgical/deep wound)     Status: None   Collection Time: 02/19/23  8:30 PM   Specimen: Abscess  Result Value Ref Range Status   Specimen Description   Final    ABSCESS Performed at [facility], [address], [place], [place] [address]    Special Requests   Final    NONE Performed at [facility], [address] [place], [place] [address]    Gram Stain RARE WBC SEEN RARE GRAM NEGATIVE RODS   Final   Culture   Final    RARE STAPHYLOCOCCUS AUREUS RARE  ESCHERICHIA COLI RARE ENTEROCOCCUS AVIUM RARE BACTEROIDES THETAIOTAOMICRON BETA LACTAMASE POSITIVE Performed at [facility], [address] [place], [place] [address]    Report Status 02/23/2023 FINAL  Final   Organism ID, Bacteria STAPHYLOCOCCUS AUREUS  Final   Organism ID, Bacteria ESCHERICHIA COLI  Final   Organism ID, Bacteria ENTEROCOCCUS AVIUM  Final      Susceptibility   Enterococcus avium - MIC*    AMPICILLIN <=2 SENSITIVE Sensitive     VANCOMYCIN <=0.5 SENSITIVE Sensitive     GENTAMICIN SYNERGY SENSITIVE Sensitive     * RARE ENTEROCOCCUS AVIUM   Escherichia coli - MIC*    AMPICILLIN >=32 RESISTANT Resistant     CEFEPIME <=0.12 SENSITIVE Sensitive     CEFTAZIDIME <=1 SENSITIVE Sensitive     CEFTRIAXONE <=0.25 SENSITIVE Sensitive     CIPROFLOXACIN <=0.25 SENSITIVE Sensitive  GENTAMICIN <=1 SENSITIVE Sensitive     IMIPENEM <=0.25 SENSITIVE Sensitive     TRIMETH/SULFA <=20 SENSITIVE Sensitive     AMPICILLIN/SULBACTAM 16 INTERMEDIATE Intermediate     PIP/TAZO <=4 SENSITIVE Sensitive     * RARE ESCHERICHIA COLI   Staphylococcus aureus - MIC*    CIPROFLOXACIN >=8 RESISTANT Resistant     ERYTHROMYCIN <=0.25 SENSITIVE Sensitive     GENTAMICIN <=0.5 SENSITIVE Sensitive     OXACILLIN >=4 RESISTANT Resistant     TETRACYCLINE <=1 SENSITIVE Sensitive     VANCOMYCIN 1 SENSITIVE Sensitive     TRIMETH/SULFA <=10 SENSITIVE Sensitive     CLINDAMYCIN <=0.25 SENSITIVE Sensitive     RIFAMPIN <=0.5 SENSITIVE Sensitive     Inducible Clindamycin NEGATIVE Sensitive     LINEZOLID 2 SENSITIVE Sensitive     * RARE STAPHYLOCOCCUS AUREUS   Recommend calling patient and if not improving on doxycycline, adding Augmentin to broaden antimicrobial coverage.   New antibiotic prescription: Augmentin 875mg  BID x 5 days  ED Provider: Kristine Royal, MD  Mia Waters 02/24/2023, 8:39 AM Clinical Pharmacist

## 2023-02-24 NOTE — Telephone Encounter (Signed)
Post ED Visit - Positive Culture Follow-up: Unsuccessful Patient Follow-up  Culture assessed and recommendations reviewed by:  [x]  Ivery Quale, Pharm.D. []  Celedonio Miyamoto, Pharm.D., BCPS AQ-ID []  Garvin Fila, Pharm.D., BCPS []  Georgina Pillion, Pharm.D., BCPS []  [place], [address].D., BCPS, AAHIVP []  Estella Husk, Pharm.D., BCPS, AAHIVP []  Sherlynn Carbon, PharmD []  Pollyann Samples, PharmD, BCPS  Positive wound culture  []  Patient discharged without antimicrobial prescription and treatment is now indicated [x]  Organism is resistant to prescribed ED discharge antimicrobial []  Patient with positive blood cultures   Plan: Call patient for S/S check, if not improving start Augmentin 875 mg po TID x 5 days per ED provider Kristine Royal, MD  Unable to contact patient after 3 attempts, letter will be sent to address on file  Sandria Senter 02/24/2023, 2:25 PM

## 2023-02-25 ENCOUNTER — Other Ambulatory Visit: Payer: Self-pay | Admitting: General Surgery

## 2023-02-28 ENCOUNTER — Ambulatory Visit (INDEPENDENT_AMBULATORY_CARE_PROVIDER_SITE_OTHER): Payer: Medicaid Other | Admitting: General Surgery

## 2023-02-28 ENCOUNTER — Encounter: Payer: Self-pay | Admitting: General Surgery

## 2023-02-28 VITALS — BP 125/86 | HR 112 | Temp 98.1°F | Resp 16 | Ht 68.0 in | Wt 231.0 lb

## 2023-02-28 DIAGNOSIS — Z09 Encounter for follow-up examination after completed treatment for conditions other than malignant neoplasm: Secondary | ICD-10-CM

## 2023-02-28 MED ORDER — LORAZEPAM 1 MG PO TABS: 1.0000 mg | ORAL_TABLET | Freq: Three times a day (TID) | ORAL | 0 refills | Status: DC | PRN

## 2023-02-28 MED ORDER — OXYCODONE HCL 10 MG PO TABS: 10.0000 mg | ORAL_TABLET | Freq: Four times a day (QID) | ORAL | 0 refills | Status: AC

## 2023-02-28 NOTE — Patient Instructions (Signed)
Keep wound clean with soap and water daily.  Apply triple antibiotic cream daily.

## 2023-03-01 NOTE — Progress Notes (Signed)
Subjective:     Mia Waters  Patient presents for follow-up wound check, status post exploratory laparotomy for perforated appendicitis.  Patient states she had some drainage from the upper portion of her wound.  She was started on antibiotic.  She now has 2 areas of just clear yellow drainage.  She denies any fever or chills. Objective:    BP 125/86   Pulse (!) 112   Temp 98.1 F (36.7 C) (Oral)   Resp 16   Ht 5\' 8"  (1.727 m)   Wt 231 lb (104.8 kg)   LMP 02/06/2023 (Exact Date)   SpO2 96%   BMI 35.12 kg/m   General:  alert, cooperative, and no distress  Abdomen is soft and nontender.  2 small areas along the upper portion of her midline incision with granulation tissue present.  No erythema or purulent drainage noted.  Silver nitrate was applied to both wounds.     Assessment:    Doing well postoperatively. Superficial wound separation, resolving no deep abscess present.    Plan:   I told the patient to keep the wound clean and dry daily with soap and water.  She may apply triple antibiotic ointment.  I did reorder her oxycodone and Ativan for 3 days until she sees her pain clinic physician and her primary care physician.  Follow-up here as needed.

## 2023-03-07 ENCOUNTER — Other Ambulatory Visit: Payer: Self-pay | Admitting: General Surgery

## 2023-03-07 ENCOUNTER — Telehealth (HOSPITAL_BASED_OUTPATIENT_CLINIC_OR_DEPARTMENT_OTHER): Payer: Self-pay | Admitting: Emergency Medicine

## 2023-03-07 NOTE — Telephone Encounter (Signed)
Post ED Visit - Positive Culture Follow-up: Successful Patient Follow-Up  Culture assessed and recommendations reviewed by:  []  Enzo Bi, Pharm.D. []  Celedonio Miyamoto, Pharm.D., BCPS AQ-ID []  Garvin Fila, Pharm.D., BCPS []  Georgina Pillion, Pharm.D., BCPS []  [place], [address].D., BCPS, AAHIVP []  Estella Husk, Pharm.D., BCPS, AAHIVP []  Lysle Pearl, PharmD, BCPS []  Phillips Climes, PharmD, BCPS []  Agapito Games, PharmD, BCPS []  Verlan Friends, PharmD  Positive aerobic/anaerobic culture  []  Patient discharged without antimicrobial prescription and treatment is now indicated [x]  Organism is resistant to prescribed ED discharge antimicrobial []  Patient with positive blood cultures  Changes discussed with ED provider: Kristine Royal, MD New antibiotic prescription Augmentin 875 mg three times a day for five days Called to [facility] [address] [place], [place]) [phone]  Contacted patient, date 03/07/23, time 1630 Patient called after receiving letter. Reports continued symptoms. Patient to start Augmentin per instructions if patient was not improving.    Pamala Hurry 03/07/2023, 4:36 PM

## 2023-03-27 ENCOUNTER — Telehealth: Payer: Self-pay | Admitting: *Deleted

## 2023-03-27 NOTE — Telephone Encounter (Signed)
Received faxed confirmation.  

## 2023-03-27 NOTE — Telephone Encounter (Signed)
Received fax from [facility] ~ [phone]~ telephone/ [phone]~ fax.   Electronically signed HIPPA Compliant Authorization for Release of Medical Information enclosed with date of 03/16/2023.  Medical records requested: abstract of all medical records from June 27, 2017- present   Records from [facility] faxed to [facility] ~ [phone]~ telephone/ [phone]~ fax.

## 2023-03-28 ENCOUNTER — Encounter: Payer: Self-pay | Admitting: Internal Medicine

## 2023-04-17 ENCOUNTER — Other Ambulatory Visit: Payer: Self-pay | Admitting: Anesthesiology

## 2023-04-17 DIAGNOSIS — M5030 Other cervical disc degeneration, unspecified cervical region: Secondary | ICD-10-CM

## 2023-04-17 DIAGNOSIS — M5430 Sciatica, unspecified side: Secondary | ICD-10-CM

## 2023-05-09 ENCOUNTER — Encounter: Payer: Self-pay | Admitting: Gastroenterology

## 2023-05-09 ENCOUNTER — Ambulatory Visit (INDEPENDENT_AMBULATORY_CARE_PROVIDER_SITE_OTHER): Payer: Medicaid Other | Admitting: Gastroenterology

## 2023-05-09 VITALS — BP 130/86 | HR 102 | Temp 97.8°F | Ht 68.0 in | Wt 243.4 lb

## 2023-05-09 DIAGNOSIS — K219 Gastro-esophageal reflux disease without esophagitis: Secondary | ICD-10-CM | POA: Diagnosis not present

## 2023-05-09 DIAGNOSIS — K3532 Acute appendicitis with perforation and localized peritonitis, without abscess: Secondary | ICD-10-CM

## 2023-05-09 DIAGNOSIS — K581 Irritable bowel syndrome with constipation: Secondary | ICD-10-CM | POA: Diagnosis not present

## 2023-05-09 DIAGNOSIS — R933 Abnormal findings on diagnostic imaging of other parts of digestive tract: Secondary | ICD-10-CM

## 2023-05-09 MED ORDER — OMEPRAZOLE 20 MG PO CPDR: 20.0000 mg | DELAYED_RELEASE_CAPSULE | Freq: Every day | ORAL | 3 refills | Status: DC

## 2023-05-09 NOTE — Progress Notes (Signed)
GI Office Note    Referring Provider: Fanny Dance, FNP Primary Care Physician:  Fanny Dance, FNP  Primary Gastroenterologist: Gerrit Friends.Rourk, MD  Chief Complaint   Chief Complaint  Patient presents with   Abdominal Pain    Abdominal pain, possible Crohn's.    History of Present Illness   Mia Waters is a 42 y.o. female presenting today at the request of Allison Quarry, Glenice Laine, FNP for chronic abdominal pain and reported intestinal strictures.  Per review of referral paperwork from PCP, patient had reported history of exploratory laparotomy secondary to perforated appendicitis in the past and patient stated she was told there was intestinal strictures evident on her CT scan but was not able to elaborate on this any further and no records available.  Also in paperwork it appears that patient reported large amounts of pus draining from her incision site and had been released from surgeon care.  States she had been changing her dressing twice daily as recommended.  She also requested a Lyme disease test given she had tick bite in June 2021.  Per physical exam and reports she had a healing incision from recent laparotomy that was about 1.5 x 1 cm area of the proximal and with some thick yellow discharge and pink granulation tissue underneath 0.5 x 0.5 cm open area midway superior to umbilicus.  Labs 03/07/2023: Normal lipid panel.  Normal LFTs and renal function.  Labs negative for [facility] spotted fever and Lyme disease.  Thyroid function within normal limits.  B12 within normal limits  CT A/P 02/05/23 IMPRESSION: 1. Study is positive for bowel perforation. Overall, the appearance is suggestive of inflammatory bowel disease such as Crohn's enteritis, as detailed above, with probable perforation in the region of the distal and terminal ileum, and multiple loops of dilated mid small bowel suggestive of associated bowel obstruction. Surgical consultation is strongly recommended. 2. The  base of the appendix is also dilated and inflamed, however, this is favored to be part of the underlying inflammatory bowel disease rather indicative of acute appendicitis. 3. Aortic atherosclerosis. 4. Additional incidental findings, as above.  CT A/P 02/19/2023 IMPRESSION: 1. Midline abdominal wound consistent with recent surgery. Soft tissue thickening and small amount of gas in the subcutaneous tissues at site of wound. Small amount of ill-defined fluid just above the umbilicus in the midline measuring 2.6 x 1.6 x 4.1 cm, no definite peripheral enhancement. Sterility is indeterminate by imaging. 2. Persistent but improving fluid-filled prominent loops of small bowel centrally. Suspect improving postoperative ileus. 3. Trace right pleural effusion. Mild lower lobe atelectasis.  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 Did not have diarrhea. Years ago she took bentyl but stopped that and just will use miralax. As needed, not on daily basis. Does have fatigue, keeps her from doing normal activities. No family history of celiac disease. Takes metamucil occasional, not daily. On average has a BM every 1-3 days.   Since June 2021 she does have chronic nausea. Does have acid reflux since mid 2010's. Used to take nexium otx and that was expensive. No vomiting, takes Zofran once daily on chronic basis. This allows her to eat good. Appetite is good. No melena or brbpr.   No family history of chron's or ulcerative colitis.   Mother has rheumatoid arthritis. She has osteoarthritis.  Has never had an EGD or colonoscopy.   Wt Readings from Last 3 Encounters:  02/28/23 231 lb (104.8 kg)  02/19/23 224  lb (101.6 kg)  02/08/23 253 lb 8.5 oz (115 kg)   Current Outpatient Medications  Medication Sig Dispense Refill   amLODipine (NORVASC) 10 MG tablet Take 10 mg by mouth at bedtime.     buPROPion (WELLBUTRIN SR) 150 MG 12 hr tablet Take 150 mg by mouth 2 (two) times daily.     cyclobenzaprine  (FLEXERIL) 10 MG tablet Take 10 mg by mouth 3 (three) times daily.     doxycycline (VIBRAMYCIN) 100 MG capsule Take 1 capsule (100 mg total) by mouth 2 (two) times daily. 20 capsule 0   LORazepam (ATIVAN) 1 MG tablet Take 1 tablet (1 mg total) by mouth every 8 (eight) hours as needed for anxiety. 20 tablet 0   losartan-hydrochlorothiazide (HYZAAR) 100-25 MG tablet Take 1 tablet by mouth daily.     ondansetron (ZOFRAN) 4 MG tablet Take 4 mg by mouth every 8 (eight) hours as needed.     ondansetron (ZOFRAN-ODT) 4 MG disintegrating tablet Take 1 tablet (4 mg total) by mouth every 8 (eight) hours as needed for nausea. 10 tablet 0   Oxycodone HCl 10 MG TABS Take 1 tablet (10 mg total) by mouth every 6 (six) hours. 15 tablet 0   polyethylene glycol (MIRALAX / GLYCOLAX) 17 g packet Take 17 g by mouth in the morning.     pregabalin (LYRICA) 150 MG capsule Take 150 mg by mouth every 8 (eight) hours.     Psyllium 25 % POWD Take 10 mg by mouth daily.     No current facility-administered medications for this visit.    Past Medical History:  Diagnosis Date   Arthritis    Coccyx    Asthma    "grew out of it'   Fibromyalgia    Gastritis    GERD (gastroesophageal reflux disease)    H/O hiatal hernia    Headache    History of migraine    Hypertension    Pyelonephritis    Sciatic pain, left    Vaginal Pap smear, abnormal    f/u ok    Past Surgical History:  Procedure Laterality Date   APPENDECTOMY  02/05/2023   Procedure: APPENDECTOMY;  Surgeon: Franky Macho, MD;  Location: [facility];  Service: General;;   DILATION AND CURETTAGE OF UTERUS     LAPAROTOMY N/A 02/05/2023   Procedure: EXPLORATION LAPAROTOMY;  Surgeon: Franky Macho, MD;  Location: [facility];  Service: General;  Laterality: N/A;   MOUTH SURGERY     UMBILICAL HERNIA REPAIR N/A 04/03/2014   Procedure: HERNIA REPAIR UMBILICAL ;  Surgeon: Marlane Hatcher, MD;  Location: [facility];  Service: General;  Laterality: N/A;    Family History   Problem Relation Age of Onset   Diabetes Mother    Hypertension Mother    Heart disease Mother        heart failure   Stroke Mother        "mini strokes"   Heart disease Father        congenital   Healthy Brother    Asthma Daughter    Healthy Daughter    Healthy Son    Healthy Brother    Healthy Son    Healthy Daughter    Healthy Daughter    Cancer Other    Heart disease Other        grandfather   Alzheimer's disease Other        grandmother   Colon cancer Neg Hx     Allergies as of  05/09/2023   (No Known Allergies)    Social History   Socioeconomic History   Marital status: Single    Spouse name: Not on file   Number of children: Not on file   Years of education: Not on file   Highest education level: Not on file  Occupational History   Not on file  Tobacco Use   Smoking status: Some Days    Current packs/day: 0.00    Average packs/day: 0.5 packs/day for 20.0 years (10.0 ttl pk-yrs)    Types: Cigarettes, E-cigarettes    Start date: 05/18/1995    Last attempt to quit: 05/18/2015    Years since quitting: 7.9   Smokeless tobacco: Never   Tobacco comments:    reports quit cigarettes, occ e-cigarettes  Vaping Use   Vaping status: Never Used  Substance and Sexual Activity   Alcohol use: Not Currently    Alcohol/week: 1.0 standard drink of alcohol    Types: 1 Glasses of wine per week    Comment: 2-4 times per month   Drug use: No    Comment: prescription opiates   Sexual activity: Not Currently    Birth control/protection: None  Other Topics Concern   Not on file  Social History Narrative   Not on file   Social Determinants of Health   Financial Resource Strain: Low Risk  (04/14/2020)   Overall Financial Resource Strain (CARDIA)    Difficulty of Paying Living Expenses: Not hard at all  Food Insecurity: No Food Insecurity (02/10/2023)   Hunger Vital Sign    Worried About Running Out of Food in the Last Year: Never true    Ran Out of Food in the Last  Year: Never true  Transportation Needs: No Transportation Needs (02/10/2023)   PRAPARE - Administrator, Civil Service (Medical): No    Lack of Transportation (Non-Medical): No  Physical Activity: Inactive (04/14/2020)   Exercise Vital Sign    Days of Exercise per Week: 0 days    Minutes of Exercise per Session: 0 min  Stress: No Stress Concern Present (04/14/2020)   [employer] of Occupational Health - Occupational Stress Questionnaire    Feeling of Stress : Not at all  Social Connections: Socially Isolated (04/14/2020)   Social Connection and Isolation Panel [NHANES]    Frequency of Communication with Friends and Family: More than three times a week    Frequency of Social Gatherings with Friends and Family: Once a week    Attends Religious Services: Never    Database administrator or Organizations: No    Attends Banker Meetings: Never    Marital Status: Never married  Intimate Partner Violence: Not At Risk (02/10/2023)   Humiliation, Afraid, Rape, and Kick questionnaire    Fear of Current or Ex-Partner: No    Emotionally Abused: No    Physically Abused: No    Sexually Abused: No     Review of Systems   Gen: Denies any fever, chills, fatigue, weight loss, lack of appetite.  CV: Denies chest pain, heart palpitations, peripheral edema, syncope.  Resp: Denies shortness of breath at rest or with exertion. Denies wheezing or cough.  GI: see HPI GU : Denies urinary burning, urinary frequency, urinary hesitancy MS: Denies joint pain, muscle weakness, cramps, or limitation of movement.  Derm: Denies rash, itching, dry skin Psych: Denies depression, anxiety, memory loss, and confusion Heme: Denies bruising, bleeding, and enlarged lymph nodes.  Physical Exam   There were  no vitals taken for this visit.  General:   Alert and oriented. Pleasant and cooperative. Well-nourished and well-developed.  Head:  Normocephalic and atraumatic. Eyes:  Without  icterus, sclera clear and conjunctiva pink.  Ears:  Normal auditory acuity. Mouth:  No deformity or lesions, oral mucosa pink.  Lungs:  Clear to auscultation bilaterally. No wheezes, rales, or rhonchi. No distress.  Heart:  S1, S2 present without murmurs appreciated.  Abdomen:  +BS, soft, non-tender and non-distended. No HSM noted. No guarding or rebound. No masses appreciated.  Rectal:  Deferred  Msk:  Symmetrical without gross deformities. Normal posture. Extremities:  Without edema. Neurologic:  Alert and  oriented x4;  grossly normal neurologically. Skin:  Intact without significant lesions or rashes. Psych:  Alert and cooperative. Normal mood and affect.  Assessment   Lanika D Ran is a 42 y.o. female with a history of HTN, palpitations, heart murmur, fibromyalgia, GERD, constipation presenting today with   Appendicitis, small bowel wall thickening: In August she had significant abdominal pain and was found to have appendicitis with peritonitis due to bowel perforation.  Has small serosal tear within the small bowel and CT prior to surgery which revealed small bowel mural wall thickening concerning for inflammatory bowel disease.  Denies any family history of this.  Does not have any diarrhea, rectal bleeding, or mucus containing stools.  No extraintestinal manifestations other than osteoarthritis.  Denies any skin or eye issues.  Given these findings we will proceed with ileocolonoscopy for evaluation of inflammatory bowel disease (Chron's).  Will obtain EKG prior to colonoscopy given history of heart murmur and occasional palpitations.  Constipation: Chronic constipation, more IBS as she has associated abdominal pain. Tried bentyl in the past. Has tried Linzess in the past. Currently uses fiber as needed. Has BM every 1-3 days. Recommended daily fiber supplementation initially. Will provide linzess to augment her prep.   GERD: Will start on omeprazole 20 mg once daily given chronic GERD.   Has tried Nexium in the past over-the-counter but then was unable to afford it.  Will resume given daily symptoms.  PLAN   Proceed with ileocolonoscopy with propofol by Dr. Jena Gauss in near future: the risks, benefits, and alternatives have been discussed with the patient in detail. The patient states understanding and desires to proceed. ASA 2 EKG BMP UPT Linzess 145 mcg daily for 4 days prior Daily fiber supplement.  Start omeprazole 20 mg once daily.  GERD diet Follow up in 2-3 months   Brooke Bonito, MSN, FNP-BC, AGACNP-BC [facility]

## 2023-05-09 NOTE — Patient Instructions (Addendum)
We are getting you scheduled for colonoscopy the near future with Dr. Jena Gauss.  I am providing some samples of Linzess today.  I want you to save these to take once daily for 4 days prior to your colonoscopy.  I have sent in omeprazole for you to take 20 mg once daily for your acid reflux..  Follow a GERD diet:  Avoid fried, fatty, greasy, spicy, citrus foods. Avoid caffeine and carbonated beverages. Avoid chocolate. Try eating 4-6 small meals a day rather than 3 large meals. Do not eat within 3 hours of laying down. Prop head of bed up on wood or bricks to create a 6 inch incline.  I reviewed your disability paperwork today, given that majority of this lifting, sitting, and standing constraints are more the specialty of general surgery who did your surgery previously I would stop by Dr. Lovell Sheehan office to see if they can fill this out for you as it would likely not be valid if I feel that out since we were not involved with initial surgery.   It was a pleasure to see you today. I want to create trusting relationships with patients. If you receive a survey regarding your visit,  I greatly appreciate you taking time to fill this out on paper or through your MyChart. I value your feedback.  Brooke Bonito, MSN, FNP-BC, AGACNP-BC [facility]

## 2023-05-10 ENCOUNTER — Encounter: Payer: Self-pay | Admitting: *Deleted

## 2023-05-10 DIAGNOSIS — R011 Cardiac murmur, unspecified: Secondary | ICD-10-CM

## 2023-05-10 DIAGNOSIS — R933 Abnormal findings on diagnostic imaging of other parts of digestive tract: Secondary | ICD-10-CM

## 2023-05-11 NOTE — Telephone Encounter (Signed)
LMOVM to call back to schedule TCS with Dr. Jena Gauss ASA 2, needs linzess 4 days prior.

## 2023-05-13 ENCOUNTER — Other Ambulatory Visit: Payer: Self-pay | Admitting: General Surgery

## 2023-05-15 NOTE — Telephone Encounter (Signed)
LMTCB to schedule. Letter mailed

## 2023-05-16 MED ORDER — PEG 3350-KCL-NA BICARB-NACL 420 G PO SOLR: 4000.0000 mL | Freq: Once | ORAL | 0 refills | Status: AC

## 2023-05-16 NOTE — Addendum Note (Signed)
Addended by: Armstead Peaks on: 05/16/2023 08:59 AM   Modules accepted: Orders

## 2023-05-29 ENCOUNTER — Ambulatory Visit (HOSPITAL_COMMUNITY): Payer: Medicaid Other | Attending: Family Medicine

## 2023-05-31 ENCOUNTER — Encounter (HOSPITAL_COMMUNITY): Payer: Self-pay | Admitting: Anesthesiology

## 2023-06-01 ENCOUNTER — Ambulatory Visit (HOSPITAL_COMMUNITY): Admission: RE | Admit: 2023-06-01 | Payer: Medicaid Other | Source: Home / Self Care | Admitting: Internal Medicine

## 2023-06-01 ENCOUNTER — Encounter (HOSPITAL_COMMUNITY): Admission: RE | Payer: Self-pay | Source: Home / Self Care

## 2023-06-01 SURGERY — COLONOSCOPY WITH PROPOFOL
Anesthesia: Monitor Anesthesia Care

## 2023-07-18 ENCOUNTER — Encounter: Payer: Self-pay | Admitting: Gastroenterology

## 2023-09-12 ENCOUNTER — Ambulatory Visit
Admission: RE | Admit: 2023-09-12 | Discharge: 2023-09-12 | Disposition: A | Discharge status: Home / Self Care | Source: Ambulatory Visit | Attending: Anesthesiology | Admitting: Anesthesiology

## 2023-09-12 ENCOUNTER — Other Ambulatory Visit: Payer: Self-pay | Admitting: Anesthesiology

## 2023-09-12 ENCOUNTER — Encounter: Payer: Self-pay | Admitting: Anesthesiology

## 2023-09-12 DIAGNOSIS — M5440 Lumbago with sciatica, unspecified side: Secondary | ICD-10-CM

## 2023-10-09 ENCOUNTER — Telehealth: Payer: Self-pay | Admitting: Gastroenterology

## 2023-10-16 NOTE — Telephone Encounter (Signed)
 Returned the pt's call advising of the first available appt we have for Mia Waters (April 8 or 3pm) LMOVM for the pt to call us  back and let us  know. Waiting on a call

## 2023-10-18 ENCOUNTER — Telehealth: Payer: Self-pay | Admitting: Gastroenterology

## 2023-10-18 NOTE — Telephone Encounter (Signed)
 Pt called to reschedule called pt back and left voicemail 4/23

## 2023-10-24 NOTE — Progress Notes (Deleted)
 GI Office Note    Referring Provider: Chales Colorado, FNP Primary Care Physician:  Chales Colorado, FNP Primary Gastroenterologist: Windsor Hatcher.Rourk, MD  Date:  10/24/2023  ID:  Mia Waters, DOB [birth year], MRN [id number]   Chief Complaint   No chief complaint on file.   History of Present Illness  Mia Waters is a 43 y.o. female with a history of *** presenting today with complaint of large hernia developing***.  Per review of referral paperwork from PCP, patient had reported history of exploratory laparotomy secondary to perforated appendicitis in the past and patient stated she was told there was intestinal strictures evident on her CT scan but was not able to elaborate on this any further and no records available.  Also in paperwork it appears that patient reported large amounts of pus draining from her incision site and had been released from surgeon care.  States she had been changing her dressing twice daily as recommended.  She also requested a Lyme disease test given she had tick bite in June 2021.  Per physical exam and reports she had a healing incision from recent laparotomy that was about 1.5 x 1 cm area of the proximal and with some thick yellow discharge and pink granulation tissue underneath 0.5 x 0.5 cm open area midway superior to umbilicus.   Labs 03/07/2023: Normal lipid panel.  Normal LFTs and renal function.  Labs negative for [facility] spotted fever and Lyme disease.  Thyroid function within normal limits.  B12 within normal limits   CT A/P 02/05/23 IMPRESSION: 1. Study is positive for bowel perforation. Overall, the appearance is suggestive of inflammatory bowel disease such as Crohn's enteritis, as detailed above, with probable perforation in the region of the distal and terminal ileum, and multiple loops of dilated mid small bowel suggestive of associated bowel obstruction. Surgical consultation is strongly recommended. 2. The base of the appendix is also  dilated and inflamed, however, this is favored to be part of the underlying inflammatory bowel disease rather indicative of acute appendicitis. 3. Aortic atherosclerosis. 4. Additional incidental findings, as above.   CT A/P 02/19/2023 IMPRESSION: 1. Midline abdominal wound consistent with recent surgery. Soft tissue thickening and small amount of gas in the subcutaneous tissues at site of wound. Small amount of ill-defined fluid just above the umbilicus in the midline measuring 2.6 x 1.6 x 4.1 cm, no definite peripheral enhancement. Sterility is indeterminate by imaging. 2. Persistent but improving fluid-filled prominent loops of small bowel centrally. Suspect improving postoperative ileus. 3. Trace right pleural effusion. Mild lower lobe atelectasis.    Last office visit 05/09/23.  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  Reportedly years ago took Bentyl but stopped that and just using MiraLAX  as needed.  Has fatigue which keeps her from performing normal activities.  Denied family history of celiac.  Takes Metamucil occasionally but not on daily basis.  Average BM every 1-3 days.  Having chronic nausea since July 2021, history of reflux since 2010, used to take Nexium over-the-counter.  Denied vomiting, take Zofran  once daily chronically which allows her to eat.  Has a good appetite overall.  Denies family history of IBD.  No prior history of EGD or colonoscopy.  Advised to proceed with ileocolonoscopy for further evaluation of abnormal small bowel wall thickening on CT scan concerning for possible IBD.  Advise continue daily fiber supplementation.  Start omeprazole  20 mg once daily, can increase if needed.    Today:    Wt Readings  from Last 3 Encounters:  05/09/23 243 lb 6.4 oz (110.4 kg)  02/28/23 231 lb (104.8 kg)  02/19/23 224 lb (101.6 kg)    Current Outpatient Medications  Medication Sig Dispense Refill   amLODipine  (NORVASC ) 10 MG tablet Take 10 mg by mouth at bedtime.     buPROPion   (WELLBUTRIN  SR) 150 MG 12 hr tablet Take 150 mg by mouth 2 (two) times daily.     cyclobenzaprine  (FLEXERIL ) 10 MG tablet Take 10 mg by mouth 3 (three) times daily.     losartan-hydrochlorothiazide (HYZAAR) 100-25 MG tablet Take 1 tablet by mouth daily.     omeprazole  (PRILOSEC) 20 MG capsule Take 1 capsule (20 mg total) by mouth daily. 90 capsule 3   ondansetron  (ZOFRAN ) 4 MG tablet Take 4 mg by mouth every 8 (eight) hours as needed.     ondansetron  (ZOFRAN -ODT) 4 MG disintegrating tablet Take 1 tablet (4 mg total) by mouth every 8 (eight) hours as needed for nausea. 10 tablet 0   Oxycodone  HCl 10 MG TABS Take 1 tablet (10 mg total) by mouth every 6 (six) hours. 15 tablet 0   polyethylene glycol (MIRALAX  / GLYCOLAX ) 17 g packet Take 17 g by mouth in the morning.     pregabalin  (LYRICA ) 150 MG capsule Take 150 mg by mouth every 8 (eight) hours.     Psyllium 25 % POWD Take 10 mg by mouth daily.     No current facility-administered medications for this visit.    Past Medical History:  Diagnosis Date   Arthritis    Coccyx    Asthma    "grew out of it'   Fibromyalgia    Gastritis    GERD (gastroesophageal reflux disease)    H/O hiatal hernia    Headache    History of migraine    Hypertension    Pyelonephritis    Sciatic pain, left    Vaginal Pap smear, abnormal    f/u ok    Past Surgical History:  Procedure Laterality Date   APPENDECTOMY  02/05/2023   Procedure: APPENDECTOMY;  Surgeon: Alanda Allegra, MD;  Location: [facility];  Service: General;;   DILATION AND CURETTAGE OF UTERUS     LAPAROTOMY N/A 02/05/2023   Procedure: EXPLORATION LAPAROTOMY;  Surgeon: Alanda Allegra, MD;  Location: [facility];  Service: General;  Laterality: N/A;   MOUTH SURGERY     UMBILICAL HERNIA REPAIR N/A 04/03/2014   Procedure: HERNIA REPAIR UMBILICAL ;  Surgeon: Myrl Askew, MD;  Location: [facility];  Service: General;  Laterality: N/A;    Family History  Problem Relation Age of Onset   Diabetes Mother     Hypertension Mother    Heart disease Mother        heart failure   Stroke Mother        "mini strokes"   Heart disease Father        congenital   Healthy Brother    Asthma Daughter    Healthy Daughter    Healthy Son    Healthy Brother    Healthy Son    Healthy Daughter    Healthy Daughter    Cancer Other    Heart disease Other        grandfather   Alzheimer's disease Other        grandmother   Colon cancer Neg Hx     Allergies as of 10/26/2023   (No Known Allergies)    Social History   Socioeconomic History  Marital status: Single    Spouse name: Not on file   Number of children: Not on file   Years of education: Not on file   Highest education level: Not on file  Occupational History   Not on file  Tobacco Use   Smoking status: Some Days    Current packs/day: 0.00    Average packs/day: 0.5 packs/day for 20.0 years (10.0 ttl pk-yrs)    Types: Cigarettes, E-cigarettes    Start date: 05/18/1995    Last attempt to quit: 05/18/2015    Years since quitting: 8.4   Smokeless tobacco: Never   Tobacco comments:    reports quit cigarettes, occ e-cigarettes  Vaping Use   Vaping status: Never Used  Substance and Sexual Activity   Alcohol use: Not Currently    Alcohol/week: 1.0 standard drink of alcohol    Types: 1 Glasses of wine per week    Comment: 2-4 times per month   Drug use: No    Comment: prescription opiates   Sexual activity: Not Currently    Birth control/protection: None  Other Topics Concern   Not on file  Social History Narrative   Not on file   Social Drivers of Health   Financial Resource Strain: Low Risk  (04/14/2020)   Overall Financial Resource Strain (CARDIA)    Difficulty of Paying Living Expenses: Not hard at all  Food Insecurity: No Food Insecurity (02/10/2023)   Hunger Vital Sign    Worried About Running Out of Food in the Last Year: Never true    Ran Out of Food in the Last Year: Never true  Transportation Needs: No  Transportation Needs (02/10/2023)   PRAPARE - Administrator, Civil Service (Medical): No    Lack of Transportation (Non-Medical): No  Physical Activity: Inactive (04/14/2020)   Exercise Vital Sign    Days of Exercise per Week: 0 days    Minutes of Exercise per Session: 0 min  Stress: No Stress Concern Present (04/14/2020)   [employer] of Occupational Health - Occupational Stress Questionnaire    Feeling of Stress : Not at all  Social Connections: Socially Isolated (04/14/2020)   Social Connection and Isolation Panel [NHANES]    Frequency of Communication with Friends and Family: More than three times a week    Frequency of Social Gatherings with Friends and Family: Once a week    Attends Religious Services: Never    Database administrator or Organizations: No    Attends Banker Meetings: Never    Marital Status: Never married     Review of Systems   Gen: Denies fever, chills, anorexia. Denies fatigue, weakness, weight loss.  CV: Denies chest pain, palpitations, syncope, peripheral edema, and claudication. Resp: Denies dyspnea at rest, cough, wheezing, coughing up blood, and pleurisy. GI: See HPI Derm: Denies rash, itching, dry skin Psych: Denies depression, anxiety, memory loss, confusion. No homicidal or suicidal ideation.  Heme: Denies bruising, bleeding, and enlarged lymph nodes.  Physical Exam   There were no vitals taken for this visit.  General:   Alert and oriented. No distress noted. Pleasant and cooperative.  Head:  Normocephalic and atraumatic. Eyes:  Conjuctiva clear without scleral icterus. Mouth:  Oral mucosa pink and moist. Good dentition. No lesions. Lungs:  Clear to auscultation bilaterally. No wheezes, rales, or rhonchi. No distress.  Heart:  S1, S2 present without murmurs appreciated.  Abdomen:  +BS, soft, non-tender and non-distended. No rebound or guarding. No HSM or  masses noted. Rectal: *** Msk:  Symmetrical without  gross deformities. Normal posture. Extremities:  Without edema. Neurologic:  Alert and  oriented x4 Psych:  Alert and cooperative. Normal mood and affect.  Assessment  Mia Waters is a 43 y.o. female with a history of *** presenting today with   History of small bowel wall thickening on CT scan, history of appendicitis:  Constipation:  GERD:  PLAN   ***     Julian Obey, MSN, FNP-BC, AGACNP-BC [facility]

## 2023-10-26 ENCOUNTER — Ambulatory Visit: Admitting: Gastroenterology

## 2023-10-31 ENCOUNTER — Other Ambulatory Visit: Payer: Self-pay | Admitting: Gastroenterology

## 2023-10-31 ENCOUNTER — Encounter: Payer: Self-pay | Admitting: Gastroenterology

## 2023-10-31 DIAGNOSIS — K432 Incisional hernia without obstruction or gangrene: Secondary | ICD-10-CM

## 2023-10-31 DIAGNOSIS — R140 Abdominal distension (gaseous): Secondary | ICD-10-CM

## 2023-10-31 DIAGNOSIS — R109 Unspecified abdominal pain: Secondary | ICD-10-CM

## 2023-11-01 ENCOUNTER — Other Ambulatory Visit: Payer: Self-pay | Admitting: Gastroenterology

## 2023-11-01 ENCOUNTER — Other Ambulatory Visit: Payer: Self-pay | Admitting: *Deleted

## 2023-11-01 ENCOUNTER — Telehealth: Payer: Self-pay | Admitting: Gastroenterology

## 2023-11-01 DIAGNOSIS — K219 Gastro-esophageal reflux disease without esophagitis: Secondary | ICD-10-CM

## 2023-11-01 DIAGNOSIS — K432 Incisional hernia without obstruction or gangrene: Secondary | ICD-10-CM

## 2023-11-01 MED ORDER — ESOMEPRAZOLE MAGNESIUM 20 MG PO CPDR: 20.0000 mg | DELAYED_RELEASE_CAPSULE | Freq: Every day | ORAL | 1 refills | Status: DC

## 2023-11-01 NOTE — Telephone Encounter (Signed)
 Please send referral to general surgery given patient concern for incisional hernia.   I have ordered CT A/P with contrast to evaluate area of concern for patient. Please reach out to schedule her.   Julian Obey, MSN, APRN, FNP-BC, AGACNP-BC [facility]

## 2023-11-01 NOTE — Telephone Encounter (Signed)
 RADmd CT PA: [id number]: Abdomen and Pelvis CT, In Review  Tracking # [id number]

## 2023-11-01 NOTE — Addendum Note (Signed)
 Addended by: Alvester Johnson on: 11/01/2023 01:04 PM   Modules accepted: Orders

## 2023-11-02 NOTE — Telephone Encounter (Signed)
 Referral sent

## 2023-11-06 NOTE — Telephone Encounter (Signed)
 LMOVM to return call.

## 2023-11-06 NOTE — Telephone Encounter (Signed)
 RadMD PA for CT response :We have received your request for Abdomen and Pelvis CT along with additional records. However, the information  provided still does not support the medical necessity of these services to make a determination on this case. Please  review the documentation needed below which may allow us  to make a positive determination. Only sending daily  notes may delay authorization. Missing Clinical: Results/report of planned ileocolonoscopy

## 2023-11-09 NOTE — Telephone Encounter (Signed)
Attempted to call pt, unable to leave message due to mailbox being full.

## 2023-11-13 ENCOUNTER — Encounter: Payer: Self-pay | Admitting: *Deleted

## 2023-11-13 NOTE — Telephone Encounter (Signed)
Attempted to call pt. Unable to leave message due to mailbox being full

## 2023-11-13 NOTE — Telephone Encounter (Signed)
 Sent mychart message

## 2024-01-07 ENCOUNTER — Other Ambulatory Visit: Payer: Self-pay | Admitting: Gastroenterology

## 2024-01-07 DIAGNOSIS — K219 Gastro-esophageal reflux disease without esophagitis: Secondary | ICD-10-CM

## 2024-01-10 ENCOUNTER — Other Ambulatory Visit (HOSPITAL_COMMUNITY): Payer: Self-pay

## 2024-01-29 ENCOUNTER — Emergency Department (HOSPITAL_COMMUNITY)
Admission: EM | Admit: 2024-01-29 | Discharge: 2024-01-29 | Disposition: A | Discharge status: Home / Self Care | Attending: Emergency Medicine | Admitting: Emergency Medicine

## 2024-01-29 ENCOUNTER — Other Ambulatory Visit: Payer: Self-pay

## 2024-01-29 ENCOUNTER — Encounter (HOSPITAL_COMMUNITY): Payer: Self-pay

## 2024-01-29 ENCOUNTER — Emergency Department (HOSPITAL_COMMUNITY)

## 2024-01-29 DIAGNOSIS — W010XXA Fall on same level from slipping, tripping and stumbling without subsequent striking against object, initial encounter: Secondary | ICD-10-CM | POA: Insufficient documentation

## 2024-01-29 DIAGNOSIS — M542 Cervicalgia: Secondary | ICD-10-CM | POA: Insufficient documentation

## 2024-01-29 DIAGNOSIS — K439 Ventral hernia without obstruction or gangrene: Secondary | ICD-10-CM | POA: Diagnosis not present

## 2024-01-29 DIAGNOSIS — Y92009 Unspecified place in unspecified non-institutional (private) residence as the place of occurrence of the external cause: Secondary | ICD-10-CM | POA: Diagnosis not present

## 2024-01-29 DIAGNOSIS — R42 Dizziness and giddiness: Secondary | ICD-10-CM | POA: Diagnosis present

## 2024-01-29 DIAGNOSIS — Z79899 Other long term (current) drug therapy: Secondary | ICD-10-CM | POA: Insufficient documentation

## 2024-01-29 DIAGNOSIS — R1013 Epigastric pain: Secondary | ICD-10-CM | POA: Diagnosis not present

## 2024-01-29 DIAGNOSIS — R109 Unspecified abdominal pain: Secondary | ICD-10-CM

## 2024-01-29 LAB — CBC
HCT: 40 % (ref 36.0–46.0)
Hemoglobin: 13.2 g/dL (ref 12.0–15.0)
MCH: 29.9 pg (ref 26.0–34.0)
MCHC: 33 g/dL (ref 30.0–36.0)
MCV: 90.7 fL (ref 80.0–100.0)
Platelets: 436 K/uL — ABNORMAL HIGH (ref 150–400)
RBC: 4.41 MIL/uL (ref 3.87–5.11)
RDW: 14.2 % (ref 11.5–15.5)
WBC: 10.3 K/uL (ref 4.0–10.5)
nRBC: 0 % (ref 0.0–0.2)

## 2024-01-29 LAB — COMPREHENSIVE METABOLIC PANEL WITH GFR
ALT: 17 U/L (ref 0–44)
AST: 17 U/L (ref 15–41)
Albumin: 3.8 g/dL (ref 3.5–5.0)
Alkaline Phosphatase: 46 U/L (ref 38–126)
Anion gap: 12 (ref 5–15)
BUN: 10 mg/dL (ref 6–20)
CO2: 25 mmol/L (ref 22–32)
Calcium: 9.5 mg/dL (ref 8.9–10.3)
Chloride: 102 mmol/L (ref 98–111)
Creatinine, Ser: 0.7 mg/dL (ref 0.44–1.00)
GFR, Estimated: >60 mL/min (ref >60)
Glucose, Bld: 102 mg/dL — ABNORMAL HIGH (ref 70–99)
Potassium: 3.3 mmol/L — ABNORMAL LOW (ref 3.5–5.1)
Sodium: 139 mmol/L (ref 135–145)
Total Bilirubin: 0.3 mg/dL (ref 0.0–1.2)
Total Protein: 6.8 g/dL (ref 6.5–8.1)

## 2024-01-29 LAB — URINALYSIS, ROUTINE W REFLEX MICROSCOPIC
Bilirubin Urine: NEGATIVE
Glucose, UA: NEGATIVE mg/dL
Hgb urine dipstick: NEGATIVE
Ketones, ur: NEGATIVE mg/dL
Leukocytes,Ua: NEGATIVE
Nitrite: NEGATIVE
Protein, ur: NEGATIVE mg/dL
Specific Gravity, Urine: 1.004 — ABNORMAL LOW (ref 1.005–1.030)
pH: 6 (ref 5.0–8.0)

## 2024-01-29 LAB — HCG, QUANTITATIVE, PREGNANCY: hCG, Beta Chain, Quant, S: <1 m[IU]/mL (ref <5)

## 2024-01-29 LAB — MAGNESIUM: Magnesium: 2.1 mg/dL (ref 1.7–2.4)

## 2024-01-29 LAB — LIPASE, BLOOD: Lipase: 26 U/L (ref 11–51)

## 2024-01-29 MED ORDER — HYDROMORPHONE HCL 1 MG/ML IJ SOLN
0.5000 mg | Freq: Once | INTRAMUSCULAR | Status: AC
  Administered 2024-01-29: 0.5 mg via INTRAVENOUS
  Filled 2024-01-29: qty 0.5

## 2024-01-29 MED ORDER — DIPHENHYDRAMINE HCL 50 MG/ML IJ SOLN
12.5000 mg | Freq: Once | INTRAMUSCULAR | Status: AC
  Administered 2024-01-29: 12.5 mg via INTRAVENOUS
  Filled 2024-01-29: qty 1

## 2024-01-29 MED ORDER — HYDROMORPHONE HCL 1 MG/ML IJ SOLN
1.0000 mg | Freq: Once | INTRAMUSCULAR | Status: AC
  Administered 2024-01-29: 1 mg via INTRAVENOUS
  Filled 2024-01-29: qty 1

## 2024-01-29 MED ORDER — IOHEXOL 350 MG/ML SOLN
75.0000 mL | Freq: Once | INTRAVENOUS | Status: AC | PRN
  Administered 2024-01-29: 75 mL via INTRAVENOUS

## 2024-01-29 MED ORDER — ONDANSETRON 4 MG PO TBDP: 4.0000 mg | ORAL_TABLET | Freq: Three times a day (TID) | ORAL | 0 refills | Status: AC | PRN

## 2024-01-29 MED ORDER — METOCLOPRAMIDE HCL 5 MG/ML IJ SOLN
10.0000 mg | Freq: Once | INTRAMUSCULAR | Status: AC
  Administered 2024-01-29: 10 mg via INTRAVENOUS
  Filled 2024-01-29: qty 2

## 2024-01-29 MED ORDER — POTASSIUM CHLORIDE CRYS ER 20 MEQ PO TBCR
40.0000 meq | EXTENDED_RELEASE_TABLET | Freq: Once | ORAL | Status: AC
  Administered 2024-01-29: 40 meq via ORAL
  Filled 2024-01-29: qty 2

## 2024-01-29 MED ORDER — LACTATED RINGERS IV BOLUS
1000.0000 mL | Freq: Once | INTRAVENOUS | Status: AC
  Administered 2024-01-29: 1000 mL via INTRAVENOUS

## 2024-01-29 NOTE — Discharge Instructions (Signed)
 You were seen for your abdominal pain and nausea in the emergency department.   At home, please stay well-hydrated and take the Zofran  for any nausea that you have.    Check your MyChart online for the results of any tests that had not resulted by the time you left the emergency department.   Follow-up with your primary doctor in 2-3 days regarding your visit.    Return immediately to the emergency department if you experience any of the following: Worsening pain, vomiting despite the medications, or any other concerning symptoms.    Thank you for visiting our Emergency Department. It was a pleasure taking care of you today.

## 2024-01-29 NOTE — ED Provider Notes (Signed)
 [place] EMERGENCY DEPARTMENT AT [facility] Provider Note   CSN: [id number] Arrival date & time: 01/29/24  1603     Patient presents with: Dizziness   Mia Waters is a 43 y.o. female.   43 year old female with a history of chronic pain, IBS, and ex lap secondary to perforated appendicitis who presents to the emergency department with dizziness and abdominal pain.  Patient reports that for the past week she has had acute on chronic abdominal pain.  Describes it is epigastric.  Says that she is been constipated for the past 3 days.  Has still been passing flatus.  Nausea but no vomiting.  Says that yesterday she slipped and fell on soap solution and did not hit her head but did hurt her neck from the whiplash.  Has been having pain on the left side of her neck since.  Says that today she went to stand up out of a chair and felt very dizzy.  Says that it caused her to fall back onto the chair but did not injure herself.  Says that since then she has felt dizzy with standing.  Does not get worse with turning her head or looking a certain direction.  Is not dizzy when she is seated.  Thinks that she may have taken too much of medication such as magnesium .  Is also on pregabalin , oxycodone , and cyclobenzaprine  for her chronic pain.       Prior to Admission medications   Medication Sig Start Date End Date Taking? Authorizing Provider  amLODipine  (NORVASC ) 10 MG tablet Take 10 mg by mouth at bedtime. 01/13/23   [provider]  buPROPion  (WELLBUTRIN  SR) 150 MG 12 hr tablet Take 150 mg by mouth 2 (two) times daily. 12/30/22   [provider]  buPROPion  (ZYBAN ) 150 MG 12 hr tablet Take 150 mg by mouth 2 (two) times daily. 10/14/23   [provider]  cyclobenzaprine  (FLEXERIL ) 10 MG tablet Take 10 mg by mouth 3 (three) times daily. 02/01/23   [provider]  esomeprazole  (NEXIUM ) 20 MG capsule TAKE 1 CAPSULE BY MOUTH EVERY DAY 01/08/24   Kennedy Charmaine CROME,  NP  losartan-hydrochlorothiazide (HYZAAR) 100-25 MG tablet Take 1 tablet by mouth daily. 01/13/23   [provider]  naloxone  (NARCAN ) nasal spray 4 mg/0.1 mL SMARTSIG:1 Spray(s) Both Nares 1-2 Times Daily 10/10/23   [provider]  ondansetron  (ZOFRAN ) 4 MG tablet Take 4 mg by mouth every 8 (eight) hours as needed. 01/13/23   [provider]  ondansetron  (ZOFRAN -ODT) 4 MG disintegrating tablet Take 1 tablet (4 mg total) by mouth every 8 (eight) hours as needed for nausea. 01/29/24   Yolande Lamar BROCKS, MD  Oxycodone  HCl 10 MG TABS Take 1 tablet (10 mg total) by mouth every 6 (six) hours. 02/28/23   Mavis Anes, MD  polyethylene glycol (MIRALAX  / GLYCOLAX ) 17 g packet Take 17 g by mouth in the morning. 09/02/22   [provider]  pregabalin  (LYRICA ) 150 MG capsule Take 150 mg by mouth every 8 (eight) hours. 01/24/23   [provider]  Psyllium 25 % POWD Take 10 mg by mouth daily.    [provider]    Allergies: Patient has no known allergies.    Review of Systems  Updated Vital Signs BP (!) 126/96   Pulse 91   Temp 97.8 F (36.6 C) (Oral)   Resp 19   Ht 5' 8 (1.727 m)   Wt 111.1 kg  LMP 01/09/2024 (Approximate)   SpO2 97%   Breastfeeding No   BMI 37.25 kg/m   Physical Exam Vitals and nursing note reviewed.  Constitutional:      General: She is not in acute distress.    Appearance: She is well-developed.  HENT:     Head: Normocephalic and atraumatic.     Right Ear: External ear normal.     Left Ear: External ear normal.     Nose: Nose normal.  Eyes:     Extraocular Movements: Extraocular movements intact.     Conjunctiva/sclera: Conjunctivae normal.     Pupils: Pupils are equal, round, and reactive to light.  Abdominal:     General: Abdomen is flat. There is no distension.     Palpations: Abdomen is soft. There is no mass.     Tenderness: There is abdominal tenderness (Epigastrium). There is no guarding.  Musculoskeletal:      Cervical back: Normal range of motion and neck supple.     Right lower leg: No edema.     Left lower leg: No edema.  Skin:    General: Skin is warm and dry.  Neurological:     Mental Status: She is alert and oriented to person, place, and time. Mental status is at baseline.     Comments: MENTAL STATUS: AAOx3 CRANIAL NERVES: II: Pupils equal and reactive for mm BL, no RAPD, no VF deficits III, IV, VI: EOM intact, no gaze preference or deviation, no nystagmus. V: normal sensation to light touch in V1, V2, and V3 segments bilaterally VII: no facial weakness or asymmetry, no nasolabial fold flattening VIII: normal hearing to speech and finger friction IX, X: normal palatal elevation, no uvular deviation XI: 5/5 head turn and 5/5 shoulder shrug bilaterally XII: midline tongue protrusion MOTOR: 5/5 strength in R shoulder flexion, elbow flexion and extension, and grip strength. 5/5 strength in L shoulder flexion, elbow flexion and extension, and grip strength.  5/5 strength in R hip and knee flexion, knee extension, ankle plantar and dorsiflexion. 5/5 strength in L hip and knee flexion, knee extension, ankle plantar and dorsiflexion. SENSORY: Normal sensation to light touch in all extremities COORD: Normal finger to nose and heel to shin, no tremor, no dysmetria STATION: No truncal ataxia   Psychiatric:        Mood and Affect: Mood normal.     (all labs ordered are listed, but only abnormal results are displayed) Labs Reviewed  COMPREHENSIVE METABOLIC PANEL WITH GFR - Abnormal; Notable for the following components:      Result Value   Potassium 3.3 (*)    Glucose, Bld 102 (*)    All other components within normal limits  CBC - Abnormal; Notable for the following components:   Platelets 436 (*)    All other components within normal limits  URINALYSIS, ROUTINE W REFLEX MICROSCOPIC - Abnormal; Notable for the following components:   Color, Urine STRAW (*)    Specific Gravity,  Urine 1.004 (*)    All other components within normal limits  LIPASE, BLOOD  HCG, QUANTITATIVE, PREGNANCY  MAGNESIUM   CBG MONITORING, ED    EKG: EKG Interpretation Date/Time:  Monday January 29 2024 16:24:03 EDT Ventricular Rate:  94 PR Interval:  146 QRS Duration:  88 QT Interval:  366 QTC Calculation: 458 R Axis:   78  Text Interpretation: Sinus rhythm Low voltage, precordial leads Borderline T wave abnormalities Confirmed by Yolande Charleston [phone]) on 01/29/2024 4:40:36 PM  Radiology: CT ANGIO HEAD  NECK W WO CM Result Date: 01/29/2024 EXAM: CTA HEAD AND NECK WITH AND WITHOUT 01/29/2024 07:23:59 PM TECHNIQUE: CTA of the head and neck was performed with and without the administration of intravenous contrast. Multiplanar 2D and/or 3D reformatted images are provided for review. Automated exposure control, iterative reconstruction, and/or weight based adjustment of the mA/kV was utilized to reduce the radiation dose to as low as reasonably achievable. Stenosis of the internal carotid arteries measured using NASCET criteria. COMPARISON: None available CLINICAL HISTORY: Neck pain sp fall, dizzy. PER EMS: pt is from home with c/o sudden onset of dizziness today around 1500. She reports she stood up to walk but was unable to walk and felt very dizzy. She fell onto a chair, but no LOC, no head injury. FINDINGS: CTA NECK: AORTIC ARCH AND ARCH VESSELS: No dissection or arterial injury. No significant stenosis of the brachiocephalic or subclavian arteries. CERVICAL CAROTID ARTERIES: No dissection, arterial injury, or hemodynamically significant stenosis by NASCET criteria. CERVICAL VERTEBRAL ARTERIES: No dissection, arterial injury, or significant stenosis. LUNGS AND MEDIASTINUM: Unremarkable. SOFT TISSUES: No acute abnormality. BONES: No acute abnormality. CTA HEAD: ANTERIOR CIRCULATION: No significant stenosis of the internal carotid arteries. No significant stenosis of the anterior cerebral arteries. No  significant stenosis of the middle cerebral arteries. No aneurysm. POSTERIOR CIRCULATION: No significant stenosis of the posterior cerebral arteries. No significant stenosis of the basilar artery. No significant stenosis of the vertebral arteries. No aneurysm. OTHER: No dural venous sinus thrombosis on this non-dedicated study. No acute intracranial hemorrhage. No edema, mass effect, or midline shift. The basilar cisterns are patent. Normal appearance of the ventricles. The posterior fossa is unremarkable. No extraaxial fluid collections. The calvarium is intact. Orbits are symmetric. Paranasal sinuses and mastoid air cells are clear. IMPRESSION: 1. No acute intracranial findings. 2. No large vessel occlusion, hemodynamically significant stenosis, or aneurysm in the head or neck. Electronically signed by: Donnice Mania MD 01/29/2024 09:40 PM EDT RP Workstation: [id number]   CT ABDOMEN PELVIS W CONTRAST Result Date: 01/29/2024 CLINICAL DATA:  Abdominal pain, constipation, history of hernia. Fell from standing yesterday at home. EXAM: CT ABDOMEN AND PELVIS WITH CONTRAST TECHNIQUE: Multidetector CT imaging of the abdomen and pelvis was performed using the standard protocol following bolus administration of intravenous contrast. RADIATION DOSE REDUCTION: This exam was performed according to the departmental dose-optimization program which includes automated exposure control, adjustment of the mA and/or kV according to patient size and/or use of iterative reconstruction technique. CONTRAST:  75mL OMNIPAQUE  IOHEXOL  350 MG/ML SOLN COMPARISON:  CT with IV contrast 02/19/2023 and 02/09/2023. FINDINGS: Lower chest: No acute abnormality. Hepatobiliary: No significant focal liver abnormality is seen. The liver is mildly steatotic and measures 21 cm length. A tiny cyst again noted in the dome of segment 2. No gallstones, gallbladder wall thickening, or biliary dilatation. Pancreas: With no focal abnormality. Spleen: No  abnormality.  No splenomegaly. Adrenals/Urinary Tract: There is no adrenal mass. No renal mass enhancement. There are punctate nonobstructive caliceal stones in the superior pole of both kidneys. No obstructing stones or hydronephrosis are seen. The bladder is unremarkable. Stomach/Bowel: There is a tiny hiatal hernia. Unremarkable contracted stomach. Normal caliber small bowel. Again noted surgical absence of the appendix. Moderate fecal stasis. No findings of acute colitis or diverticulitis. A broad-based wide-mouth supraumbilical ventral hernia has developed since last year's studies and contains a nonobstructed portion of the mid transverse colon. A smaller, also wide-mouth ventral hernia is seen just below this level containing nonobstructed small bowel,  and just below this, an umbilical-level small wide mouth ventral hernia contains nonobstructed small bowel loops. Vascular/Lymphatic: Minimal aortic atherosclerosis. No enlarged abdominal or pelvic lymph nodes. Reproductive: Uterus and bilateral adnexa are unremarkable. Other: Above described midline ventral hernias, not seen on last year's studies. Small inguinal fat hernias are unchanged. No free fluid or free air is seen. Musculoskeletal: Advanced L4-5 facet hypertrophy with grade 1 spondylolisthesis. No new osseous abnormality. No suspicious regional bone lesion. Chronic nonerosive sacroiliitis. IMPRESSION: 1. No acute CT findings in the abdomen or pelvis. 2. Constipation and diverticulosis.  The 3. Interval development of 3 wide-mouth ventral hernias, the most cephalad of which contains a nonobstructed portion of the mid transverse colon, and the other 2 containing nonobstructed small bowel. 4. Nonobstructive micronephrolithiasis. 5. Mildly prominent liver with mild steatosis. 6. Minimal aortic atherosclerosis. Aortic Atherosclerosis (ICD10-I70.0). Electronically Signed   By: Francis Quam M.D.   On: 01/29/2024 20:22   CT C-SPINE NO CHARGE Result Date:  01/29/2024 EXAM: CT CERVICAL SPINE WITHOUT CONTRAST 01/29/2024 07:23:59 PM TECHNIQUE: CT of the cervical spine was performed without the administration of intravenous contrast. Multiplanar reformatted images are provided for review. Automated exposure control, iterative reconstruction, and/or weight based adjustment of the mA/kV was utilized to reduce the radiation dose to as low as reasonably achievable. COMPARISON: MRI of the cervical spine dated 04/06/2019. CLINICAL HISTORY: PER EMS: pt is from home with c/o sudden onset of dizziness today around 1500. She reports she stood up to walk but was unable to walk and felt very dizzy. She fell onto a chair, but no LOC, no head injury. She also reports she slipped and fell ; yesterday in kid's bubble solution that was on the floor in the kitchen. No LOC or head injury from that fall but she does report neck pain that started yesterday after that fall. A\T\OX4. FINDINGS: CERVICAL SPINE: BONES AND ALIGNMENT: Chronic reversal of the normal lordosis. No evidence of traumatic malalignment. No acute fracture. DEGENERATIVE CHANGES: Intervertebral disc space height is maintained. No severe spinal canal narrowing. SOFT TISSUES: No prevertebral soft tissue swelling. IMPRESSION: 1. No acute abnormality of the cervical spine. Electronically signed by: Norman Gatlin MD 01/29/2024 08:09 PM EDT RP Workstation: [id number]     Procedures   Medications Ordered in the ED  HYDROmorphone  (DILAUDID ) injection 1 mg (1 mg Intravenous Given 01/29/24 1657)  lactated ringers  bolus 1,000 mL (0 mLs Intravenous Stopped 01/29/24 1824)  metoCLOPramide  (REGLAN ) injection 10 mg (10 mg Intravenous Given 01/29/24 1657)  diphenhydrAMINE  (BENADRYL ) injection 12.5 mg (12.5 mg Intravenous Given 01/29/24 1656)  potassium chloride  SA (KLOR-CON  M) CR tablet 40 mEq (40 mEq Oral Given 01/29/24 1829)  HYDROmorphone  (DILAUDID ) injection 0.5 mg (0.5 mg Intravenous Given 01/29/24 1829)  iohexol  (OMNIPAQUE ) 350 MG/ML  injection 75 mL (75 mLs Intravenous Contrast Given 01/29/24 1925)    Clinical Course as of 01/29/24 2343  Mon Jan 29, 2024  2057 CT ABDOMEN PELVIS W CONTRAST Constipation with nonobstructing ventral hernia [RP]    Clinical Course User Index [RP] Yolande Lamar BROCKS, MD                                 Medical Decision Making Amount and/or Complexity of Data Reviewed Labs: ordered. Radiology: ordered. Decision-making details documented in ED Course.  Risk Prescription drug management.   43 year old female with a history of chronic pain, IBS, and ex lap secondary to perforated appendicitis who presents to  the emergency department with dizziness and abdominal pain.   Initial Ddx:  Stroke, traumatic vascular injury, dehydration, orthostasis, intra-abdominal abscess/infection, SBO, hernia  MDM/Course:  Patient presents emergency department with lightheadedness.  Says it is worse with standing.  Initially was porting potentially a vertiginous sensation but it is only with standing and not with turning her head or when she is sitting still.  Does not have any neurodeficits on exam to me think this is due to stroke.  Did have a fall and is complaining of left-sided neck pain since then.  She underwent a CT of the head along with vessel imaging which did not show any acute abnormality.  CT of the C-spine did not reveal acute fracture.  She also had a CT of the abdomen and pelvis with her acute on chronic abdominal pain which did not show acute abnormality.  Was feeling much better after the IV fluids and upon reevaluation was able to tolerate p.o. as well as ambulate without difficulty.  This patient presents to the ED for concern of complaints listed in HPI, this involves an extensive number of treatment options, and is a complaint that carries with it a high risk of complications and morbidity. Disposition including potential need for admission considered.   Dispo: DC Home. Return precautions  discussed including, but not limited to, those listed in the AVS. Allowed pt time to ask questions which were answered fully prior to dc.  Records reviewed Outpatient Clinic Notes The following labs were independently interpreted: Chemistry and show no acute abnormality I independently reviewed the following imaging with scope of interpretation limited to determining acute life threatening conditions related to emergency care: CT Head and agree with the radiologist interpretation with the following exceptions: none I personally reviewed and interpreted cardiac monitoring: normal sinus rhythm  I personally reviewed and interpreted the pt's EKG: see above for interpretation  I have reviewed the patients home medications and made adjustments as needed  Portions of this note were generated with Dragon dictation software. Dictation errors may occur despite best attempts at proofreading.     Final diagnoses:  Dizziness  Abdominal pain, unspecified abdominal location  Neck pain    ED Discharge Orders          Ordered    ondansetron  (ZOFRAN -ODT) 4 MG disintegrating tablet  Every 8 hours PRN        01/29/24 2159               Yolande Lamar BROCKS, MD 01/29/24 [phone]

## 2024-01-29 NOTE — ED Notes (Signed)
 Pt upset about care received while here. I offered pt wheelchair out, she refused stating she could walk out on her own.

## 2024-01-29 NOTE — ED Notes (Signed)
 Patient advised she felt like she was mistreated when being discharged.  She requested to speak to whoever was in charge to file a complaint. This Paramedic reviewed discharge visit summary with the patient and offered to get her some pants to wear home. Information was relayed to the charge nurse and house supervisor,

## 2024-01-29 NOTE — ED Triage Notes (Addendum)
 PER EMS: pt is from home with c/o sudden onset of dizziness today around 1500. She reports she stood up to walk but was unable to walk and felt very dizzy. She fell onto a chair, but no LOC, no head injury. She also reports she slipped and fell yesterday in kid's bubble solution that was on the floor in the kitchen. No LOC or head injury from that fall but she does report neck pain that started yesterday after that fall. A&OX4.   Denies CP/SOB/n/v/d.   BP- 170/90, HR-100, RR-14, 100% RA   *EDIT TO ADD: she is also reporting abdominal pain that started yesterday but is worse today. She does have a hernia but states it feels like a different type of pain.

## 2024-02-09 NOTE — Progress Notes (Signed)
 Pregnancy test ordered to see if the patient is pregnant

## 2024-02-27 ENCOUNTER — Encounter: Payer: Self-pay | Admitting: General Surgery

## 2024-02-27 ENCOUNTER — Ambulatory Visit (INDEPENDENT_AMBULATORY_CARE_PROVIDER_SITE_OTHER): Admitting: General Surgery

## 2024-02-27 VITALS — BP 166/81 | HR 102 | Temp 97.9°F | Resp 16 | Ht 68.0 in | Wt 272.0 lb

## 2024-02-27 DIAGNOSIS — K432 Incisional hernia without obstruction or gangrene: Secondary | ICD-10-CM

## 2024-02-27 NOTE — Progress Notes (Signed)
 Mia Waters; [id number]; Sep 25, 1980   HPI Patient is a 43 year old white female status post exploratory laparotomy for perforated appendicitis in August 2024 who presents back with abdominal swelling secondary to known incisional hernias.  This was diagnosed by CT scan of the abdomen.  Patient states she does have discomfort of the abdominal wall when straining or coughing.  Occasional nausea is noted.  No incarceration has been noted. Past Medical History:  Diagnosis Date   Arthritis    Coccyx    Asthma    grew out of it'   Fibromyalgia    Gastritis    GERD (gastroesophageal reflux disease)    H/O hiatal hernia    Headache    History of migraine    Hypertension    Pyelonephritis    Sciatic pain, left    Vaginal Pap smear, abnormal    f/u ok    Past Surgical History:  Procedure Laterality Date   APPENDECTOMY  02/05/2023   Procedure: APPENDECTOMY;  Surgeon: Mavis Anes, MD;  Location: [facility];  Service: General;;   DILATION AND CURETTAGE OF UTERUS     LAPAROTOMY N/A 02/05/2023   Procedure: EXPLORATION LAPAROTOMY;  Surgeon: Mavis Anes, MD;  Location: [facility];  Service: General;  Laterality: N/A;   MOUTH SURGERY     UMBILICAL HERNIA REPAIR N/A 04/03/2014   Procedure: HERNIA REPAIR UMBILICAL ;  Surgeon: Elsie GORMAN Holland, MD;  Location: [facility];  Service: General;  Laterality: N/A;    Family History  Problem Relation Age of Onset   Diabetes Mother    Hypertension Mother    Heart disease Mother        heart failure   Stroke Mother        mini strokes   Heart disease Father        congenital   Healthy Brother    Asthma Daughter    Healthy Daughter    Healthy Son    Healthy Brother    Healthy Son    Healthy Daughter    Healthy Daughter    Cancer Other    Heart disease Other        grandfather   Alzheimer's disease Other        grandmother   Colon cancer Neg Hx     Current Outpatient Medications on File Prior to Visit  Medication Sig Dispense Refill    amLODipine  (NORVASC ) 10 MG tablet Take 10 mg by mouth at bedtime.     buPROPion  (WELLBUTRIN  SR) 150 MG 12 hr tablet Take 150 mg by mouth 2 (two) times daily.     buPROPion  (ZYBAN ) 150 MG 12 hr tablet Take 150 mg by mouth 2 (two) times daily.     cyclobenzaprine  (FLEXERIL ) 10 MG tablet Take 10 mg by mouth 3 (three) times daily.     esomeprazole  (NEXIUM ) 20 MG capsule TAKE 1 CAPSULE BY MOUTH EVERY DAY 30 capsule 1   FLUoxetine (PROZAC) 10 MG capsule Take 10 mg by mouth daily.     losartan-hydrochlorothiazide (HYZAAR) 100-25 MG tablet Take 1 tablet by mouth daily.     naloxone  (NARCAN ) nasal spray 4 mg/0.1 mL SMARTSIG:1 Spray(s) Both Nares 1-2 Times Daily     ondansetron  (ZOFRAN ) 4 MG tablet Take 4 mg by mouth every 8 (eight) hours as needed.     ondansetron  (ZOFRAN -ODT) 4 MG disintegrating tablet Take 1 tablet (4 mg total) by mouth every 8 (eight) hours as needed for nausea. 10 tablet 0   Oxycodone  HCl 10  MG TABS Take 1 tablet (10 mg total) by mouth every 6 (six) hours. 15 tablet 0   polyethylene glycol (MIRALAX  / GLYCOLAX ) 17 g packet Take 17 g by mouth in the morning.     pregabalin  (LYRICA ) 150 MG capsule Take 150 mg by mouth every 8 (eight) hours.     Psyllium 25 % POWD Take 10 mg by mouth daily.     No current facility-administered medications on file prior to visit.    No Known Allergies  Social History   Substance and Sexual Activity  Alcohol Use Not Currently   Alcohol/week: 1.0 standard drink of alcohol   Types: 1 Glasses of wine per week   Comment: 2-4 times per month    Social History   Tobacco Use  Smoking Status Some Days   Current packs/day: 0.50   Average packs/day: 0.5 packs/day for 28.8 years (14.4 ttl pk-yrs)   Types: Cigarettes, E-cigarettes   Start date: 05/18/1995  Smokeless Tobacco Never  Tobacco Comments   reports quit cigarettes, occ e-cigarettes    Review of Systems  Constitutional:  Positive for malaise/fatigue.  HENT: Negative.    Eyes: Negative.    Respiratory: Negative.    Cardiovascular: Negative.   Gastrointestinal:  Positive for abdominal pain and nausea.  Musculoskeletal:  Positive for myalgias.  Skin: Negative.   Neurological: Negative.   Endo/Heme/Allergies: Negative.   Psychiatric/Behavioral: Negative.      Objective   Vitals:   02/27/24 1436  BP: (!) 166/81  Pulse: (!) 102  Resp: 16  Temp: 97.9 F (36.6 C)  SpO2: 95%    Physical Exam Vitals reviewed.  Constitutional:      Appearance: Normal appearance. She is obese. She is not ill-appearing.  HENT:     Head: Normocephalic and atraumatic.  Cardiovascular:     Rate and Rhythm: Normal rate and regular rhythm.     Heart sounds: Normal heart sounds. No murmur heard.    No friction rub. No gallop.  Pulmonary:     Effort: Pulmonary effort is normal. No respiratory distress.     Breath sounds: Normal breath sounds. No stridor. No wheezing, rhonchi or rales.  Abdominal:     General: Bowel sounds are normal. There is no distension.     Palpations: Abdomen is soft. There is no mass.     Tenderness: There is no abdominal tenderness. There is no guarding or rebound.     Hernia: A hernia is present.     Comments: Patient has multiple midline incisional hernias.  The most prominent is around the umbilicus.  Is easily reducible.  Skin:    General: Skin is warm and dry.  Neurological:     Mental Status: She is alert and oriented to person, place, and time.     CT ABDOMEN PELVIS W CONTRAST (Accession [id number]) (Order [id number]) Imaging Date: 01/29/2024 Department: [facility] Released By/Authorizing: Yolande Lamar BROCKS, MD (auto-released)   Exam Status  Status  Final [99]   PACS Intelerad Image Link   Show images for CT ABDOMEN PELVIS W CONTRAST Related Results          CT ANGIO HEAD NECK W WO CM Final result 01/29/2024 7:23 PM      EXAM: CTA HEAD AND NECK WITH AND WITHOUT 01/29/2024 07:23:59  PM  TECHNIQUE: CTA of the head and neck was performed with and without the administration of intravenous contrast. Multiplanar 2D and/or 3D reformatted images are provided for review.  Automated exposure control, iterative reconstruction, and/or weight based adjustment of the mA/kV was utilized to reduce the radiation dose to as low as reasonably achievable. Stenosis of the internal carotid arteries ...     CT C-SPINE NO CHARGE Final result 01/29/2024 7:23 PM      EXAM: CT CERVICAL SPINE WITHOUT CONTRAST 01/29/2024 07:23:59 PM  TECHNIQUE: CT of the cervical spine was performed without the administration of intravenous contrast. Multiplanar reformatted images are provided for review. Automated exposure control, iterative reconstruction, and/or weight based adjustment of the mA/kV was utilized to reduce the radiation dose to as low as reasonably achievable. ...   Study Result  Narrative & Impression  CLINICAL DATA:  Abdominal pain, constipation, history of hernia. Fell from standing yesterday at home.   EXAM: CT ABDOMEN AND PELVIS WITH CONTRAST   TECHNIQUE: Multidetector CT imaging of the abdomen and pelvis was performed using the standard protocol following bolus administration of intravenous contrast.   RADIATION DOSE REDUCTION: This exam was performed according to the departmental dose-optimization program which includes automated exposure control, adjustment of the mA and/or kV according to patient size and/or use of iterative reconstruction technique.   CONTRAST:  75mL OMNIPAQUE  IOHEXOL  350 MG/ML SOLN   COMPARISON:  CT with IV contrast 02/19/2023 and 02/09/2023.   FINDINGS: Lower chest: No acute abnormality.   Hepatobiliary: No significant focal liver abnormality is seen. The liver is mildly steatotic and measures 21 cm length. A tiny cyst again noted in the dome of segment 2. No gallstones, gallbladder wall thickening, or biliary dilatation.   Pancreas: With no  focal abnormality.   Spleen: No abnormality.  No splenomegaly.   Adrenals/Urinary Tract: There is no adrenal mass. No renal mass enhancement.   There are punctate nonobstructive caliceal stones in the superior pole of both kidneys.   No obstructing stones or hydronephrosis are seen. The bladder is unremarkable.   Stomach/Bowel: There is a tiny hiatal hernia. Unremarkable contracted stomach. Normal caliber small bowel.   Again noted surgical absence of the appendix. Moderate fecal stasis. No findings of acute colitis or diverticulitis.   A broad-based wide-mouth supraumbilical ventral hernia has developed since last year's studies and contains a nonobstructed portion of the mid transverse colon.   A smaller, also wide-mouth ventral hernia is seen just below this level containing nonobstructed small bowel, and just below this, an umbilical-level small wide mouth ventral hernia contains nonobstructed small bowel loops.   Vascular/Lymphatic: Minimal aortic atherosclerosis. No enlarged abdominal or pelvic lymph nodes.   Reproductive: Uterus and bilateral adnexa are unremarkable.   Other: Above described midline ventral hernias, not seen on last year's studies. Small inguinal fat hernias are unchanged. No free fluid or free air is seen.   Musculoskeletal: Advanced L4-5 facet hypertrophy with grade 1 spondylolisthesis. No new osseous abnormality. No suspicious regional bone lesion. Chronic nonerosive sacroiliitis.   IMPRESSION: 1. No acute CT findings in the abdomen or pelvis. 2. Constipation and diverticulosis.  The 3. Interval development of 3 wide-mouth ventral hernias, the most cephalad of which contains a nonobstructed portion of the mid transverse colon, and the other 2 containing nonobstructed small bowel. 4. Nonobstructive micronephrolithiasis. 5. Mildly prominent liver with mild steatosis. 6. Minimal aortic atherosclerosis.   Aortic Atherosclerosis  (ICD10-I70.0).     Electronically Signed   By: Francis Quam M.D.   On: 01/29/2024 20:22      Result History    Assessment  Multiple incisional hernias Plan  I told the patient that given  her multiple large incisional hernias, I would like to refer her to our hernia specialist at [facility] as she may need to be considered for possible component separation hernia repair, which I do not have that expertise.  She understands and agrees.  Will refer to Dr. Allyne at [facility] for further evaluation and treatment.  Follow-up here as needed.

## 2024-02-28 NOTE — Addendum Note (Signed)
 Addended by: SAUNDRA TAWNI DEL on: 02/28/2024 08:57 AM   Modules accepted: Orders

## 2024-03-25 ENCOUNTER — Other Ambulatory Visit: Payer: Self-pay | Admitting: Gastroenterology

## 2024-03-25 DIAGNOSIS — K219 Gastro-esophageal reflux disease without esophagitis: Secondary | ICD-10-CM
